# Patient Record
Sex: Female | Born: 1990 | Race: White | Hispanic: No | Marital: Married | Smoking: Never smoker
Health system: Southern US, Community
[De-identification: ages and names within clinical notes are randomized; demographics above are authoritative.]

## PROBLEM LIST (undated history)

## (undated) DIAGNOSIS — Z9889 Other specified postprocedural states: Secondary | ICD-10-CM

## (undated) DIAGNOSIS — R112 Nausea with vomiting, unspecified: Secondary | ICD-10-CM

## (undated) DIAGNOSIS — C439 Malignant melanoma of skin, unspecified: Secondary | ICD-10-CM

## (undated) DIAGNOSIS — K219 Gastro-esophageal reflux disease without esophagitis: Secondary | ICD-10-CM

## (undated) DIAGNOSIS — G971 Other reaction to spinal and lumbar puncture: Secondary | ICD-10-CM

## (undated) DIAGNOSIS — K649 Unspecified hemorrhoids: Secondary | ICD-10-CM

## (undated) DIAGNOSIS — O285 Abnormal chromosomal and genetic finding on antenatal screening of mother: Secondary | ICD-10-CM

## (undated) DIAGNOSIS — I8390 Asymptomatic varicose veins of unspecified lower extremity: Secondary | ICD-10-CM

## (undated) HISTORY — PX: OTHER SURGICAL HISTORY: SHX169

## (undated) HISTORY — DX: Other reaction to spinal and lumbar puncture: G97.1

## (undated) HISTORY — DX: Other specified postprocedural states: Z98.890

## (undated) HISTORY — DX: Abnormal chromosomal and genetic finding on antenatal screening of mother: O28.5

## (undated) HISTORY — DX: Nausea with vomiting, unspecified: R11.2

## (undated) HISTORY — DX: Malignant melanoma of skin, unspecified: C43.9

## (undated) HISTORY — DX: Unspecified hemorrhoids: K64.9

---
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
## 2011-02-14 ENCOUNTER — Ambulatory Visit (INDEPENDENT_AMBULATORY_CARE_PROVIDER_SITE_OTHER): Payer: 59 | Admitting: Family Medicine

## 2011-02-14 ENCOUNTER — Encounter: Payer: Self-pay | Admitting: Family Medicine

## 2011-02-14 VITALS — BP 110/70 | HR 76 | Temp 98.3°F | Wt 123.4 lb

## 2011-02-14 DIAGNOSIS — L210 Seborrhea capitis: Secondary | ICD-10-CM | POA: Insufficient documentation

## 2011-02-14 MED ORDER — FLUOCINONIDE 0.05 % EX CREA: TOPICAL_CREAM | CUTANEOUS | Status: DC

## 2011-02-14 MED ORDER — ACYCLOVIR 400 MG PO TABS: 400.0000 mg | ORAL_TABLET | Freq: Every day | ORAL | Status: AC

## 2011-02-14 NOTE — Progress Notes (Signed)
  Subjective:    Patient ID: Hannah Walsh, female    DOB: July 21, 1990, 20 y.o.   MRN: [id number]  HPI  Rash Patient presents for evaluation of a rash involving the chest, shoulder and trunk. Rash started 3 weeks ago. Lesions are pink, and raised in texture. Rash has not changed over time. Rash is pruritic. Associated symptoms: none. Patient denies: abdominal pain, arthralgia, fever, headache, irritability and myalgia. Patient has not had contacts with similar rash. Patient has not had new exposures (soaps, lotions, laundry detergents, foods, medications, plants, insects or animals).  Was told by derm that it is pityriasis but did not give her any medication to take. Benadryl not helping with itching.   Patient Active Problem List  Diagnoses  . Routine general medical examination at a health care facility   No past medical history on file. No past surgical history on file. History  Substance Use Topics  . Smoking status: Never Smoker   . Smokeless tobacco: Not on file  . Alcohol Use: Not on file   Family History  Problem Relation Age of Onset  . Heart disease Father 49    MI  . Heart disease Maternal Grandfather 40    died of MI   No Known Allergies Current Outpatient Prescriptions on File Prior to Visit  Medication Sig Dispense Refill  . norethindrone-ethinyl estradiol (JUNEL FE 1/20) 1-20 MG-MCG per tablet Take 1 tablet by mouth daily.         The PMH, PSH, Social History, Family History, Medications, and allergies have been reviewed in [facility], and have been updated if relevant.   Review of Systems    See HPI Objective:   Physical Exam Gen:  Alert, NAD Skin:  Multiple raised, erythematous lesions on neck, trunk, back Psych:  Pleasant, NAD       Assessment & Plan:   1. Pityriasis    New.  Discussed self limiting nature. Will try acyclovir, antiviral and add lidex cream.

## 2011-07-22 ENCOUNTER — Ambulatory Visit (INDEPENDENT_AMBULATORY_CARE_PROVIDER_SITE_OTHER): Payer: 59 | Admitting: Family Medicine

## 2011-07-22 ENCOUNTER — Encounter: Payer: Self-pay | Admitting: Family Medicine

## 2011-07-22 VITALS — BP 102/80 | HR 87 | Temp 98.3°F | Wt 126.5 lb

## 2011-07-22 DIAGNOSIS — J029 Acute pharyngitis, unspecified: Secondary | ICD-10-CM

## 2011-07-22 MED ORDER — AMOXICILLIN 400 MG/5ML PO SUSR: 400.0000 mg | Freq: Three times a day (TID) | ORAL | Status: DC

## 2011-07-22 MED ORDER — AMOXICILLIN 400 MG/5ML PO SUSR: 400.0000 mg | Freq: Three times a day (TID) | ORAL | Status: AC

## 2011-07-22 NOTE — Progress Notes (Signed)
SUBJECTIVE: 21 y.o. female with sore throat, myalgias, swollen glands, headache and fever for 3 days. No history of rheumatic fever. Other symptoms: left ear pain.  Patient Active Problem List  Diagnoses  . Routine general medical examination at a health care facility  . Pityriasis   No past medical history on file. No past surgical history on file. History  Substance Use Topics  . Smoking status: Never Smoker   . Smokeless tobacco: Not on file  . Alcohol Use: Not on file   Family History  Problem Relation Age of Onset  . Heart disease Father 89    MI  . Heart disease Maternal Grandfather 40    died of MI   No Known Allergies Current Outpatient Prescriptions on File Prior to Visit  Medication Sig Dispense Refill  . norethindrone-ethinyl estradiol (JUNEL FE 1/20) 1-20 MG-MCG per tablet Take 1 tablet by mouth daily.         The PMH, PSH, Social History, Family History, Medications, and allergies have been reviewed in [facility], and have been updated if relevant.  OBJECTIVE:  BP 102/80  Pulse 87  Temp(Src) 98.3 F (36.8 C) (Oral)  Wt 126 lb 8 oz (57.38 kg)  Appears alert, well appearing, and in no distress. Ears: right ear normal, left TM red, dull, bulging Oropharynx: tonsils hypertrophied with exudate Neck: supple, no significant adenopathy Lungs: clear to auscultation, no wheezes, rales or rhonchi, symmetric air entry Rapid Strep test is negative  ASSESSMENT: Streptococcal pharyngitis- rapid strep negative to tonsils quite hypertrophied.   PLAN: Per orders. Gargle, use acetaminophen or other OTC analgesic, and take Rx fully as prescribed- amoxicillin. Call if other family members develop similar symptoms. See prn.

## 2011-08-15 ENCOUNTER — Ambulatory Visit: Payer: 59 | Admitting: Family Medicine

## 2011-08-15 ENCOUNTER — Ambulatory Visit (INDEPENDENT_AMBULATORY_CARE_PROVIDER_SITE_OTHER): Payer: 59 | Admitting: Family Medicine

## 2011-08-15 ENCOUNTER — Encounter: Payer: Self-pay | Admitting: Family Medicine

## 2011-08-15 VITALS — BP 112/80 | HR 80 | Temp 98.2°F | Wt 129.0 lb

## 2011-08-15 DIAGNOSIS — J019 Acute sinusitis, unspecified: Secondary | ICD-10-CM | POA: Insufficient documentation

## 2011-08-15 MED ORDER — AMOXICILLIN-POT CLAVULANATE 875-125 MG PO TABS: 1.0000 | ORAL_TABLET | Freq: Two times a day (BID) | ORAL | Status: DC

## 2011-08-15 NOTE — Progress Notes (Signed)
  Subjective:    Patient ID: Hannah Walsh, female    DOB: Oct 09, 1990, 21 y.o.   MRN: [id number]  HPI CC: Sinus infx?  Seen here 07/22/2011 and treated for strep pharyngitis with course of amoxicillin 400mg  tid x 10 days.  After full course of abx, felt well for 3 days then sxs started returning.  Now for last week having significant head congestion throughout the whole day as well as headaches described as sinus pressure, green mucous.  Seems to be clearing up.  Still some blood.  ST intermittently.  Mild cough.  + PNdrainage.  Tried advil cold and sinus, benadryl, OTC mucinex.  No fevers/chills, abd pain, v, ear pain, tooth pain.  No new rashes.  Dad smokes at home. + boyfriend, dad and sister sick.  No h/o asthma.  Review of Systems Per HPI    Objective:   Physical Exam  Nursing note and vitals reviewed. Constitutional: She appears well-developed and well-nourished. No distress.  HENT:  Head: Normocephalic and atraumatic.  Right Ear: Hearing, tympanic membrane, external ear and ear canal normal.  Left Ear: Hearing, tympanic membrane, external ear and ear canal normal.  Nose: No mucosal edema or rhinorrhea. Right sinus exhibits maxillary sinus tenderness. Right sinus exhibits no frontal sinus tenderness. Left sinus exhibits maxillary sinus tenderness. Left sinus exhibits no frontal sinus tenderness.  Mouth/Throat: Uvula is midline and mucous membranes are normal. Posterior oropharyngeal erythema present. No oropharyngeal exudate, posterior oropharyngeal edema or tonsillar abscesses.       Evidently congested  Eyes: Conjunctivae and EOM are normal. Pupils are equal, round, and reactive to light. No scleral icterus.  Neck: Normal range of motion. Neck supple.  Cardiovascular: Normal rate, regular rhythm, normal heart sounds and intact distal pulses.   No murmur heard. Pulmonary/Chest: Effort normal and breath sounds normal. No respiratory distress. She has no wheezes. She has no rales.    Musculoskeletal: She exhibits no edema.  Lymphadenopathy:    She has no cervical adenopathy.  Skin: Skin is warm and dry. No rash noted.       Assessment & Plan:

## 2011-08-15 NOTE — Patient Instructions (Signed)
You have a sinus infection. Take medicine as prescribed: augmentin twice daily for 10 days Push fluids and plenty of rest. Nasal saline irrigation or neti pot to help drain sinuses. May use simple mucinex with plenty of fluid to help mobilize mucous. Let us know if fever >101.5, trouble opening/closing mouth, difficulty swallowing, or worsening - you may need to be seen again. Let us know if problems with antibiotic.

## 2011-08-15 NOTE — Assessment & Plan Note (Signed)
Given progression of sxs and recent abx use will treat as bacterial sinusitis with 10d course augmentin. Supportive care as per instructions. Discussed 2nd form birth control while on abx.

## 2011-09-23 ENCOUNTER — Encounter (HOSPITAL_COMMUNITY): Payer: Self-pay | Admitting: Emergency Medicine

## 2011-09-23 ENCOUNTER — Emergency Department (HOSPITAL_COMMUNITY)
Admission: EM | Admit: 2011-09-23 | Discharge: 2011-09-23 | Disposition: A | Discharge status: Home Health | Payer: 59 | Attending: Emergency Medicine | Admitting: Emergency Medicine

## 2011-09-23 ENCOUNTER — Telehealth: Payer: Self-pay | Admitting: Family Medicine

## 2011-09-23 ENCOUNTER — Emergency Department (HOSPITAL_COMMUNITY): Payer: 59

## 2011-09-23 DIAGNOSIS — M533 Sacrococcygeal disorders, not elsewhere classified: Secondary | ICD-10-CM | POA: Insufficient documentation

## 2011-09-23 DIAGNOSIS — T148XXA Other injury of unspecified body region, initial encounter: Secondary | ICD-10-CM

## 2011-09-23 MED ORDER — OXYCODONE-ACETAMINOPHEN 5-325 MG PO TABS: 2.0000 | ORAL_TABLET | ORAL | Status: AC | PRN

## 2011-09-23 MED ORDER — IBUPROFEN 800 MG PO TABS
800.0000 mg | ORAL_TABLET | Freq: Once | ORAL | Status: AC
  Administered 2011-09-23: 800 mg via ORAL
  Filled 2011-09-23: qty 1

## 2011-09-23 MED ORDER — IBUPROFEN 800 MG PO TABS: 800.0000 mg | ORAL_TABLET | Freq: Three times a day (TID) | ORAL | Status: AC

## 2011-09-23 NOTE — Discharge Instructions (Signed)
Hannah Walsh take ibuprofen 800 every 6-8 hours with food for your pain. For severe pain he can take the Percocet do not drive with this medication. The x-rays today do not show her fracture to be her coccyx bone. Followup with your primary care physician this week. Return to the ER for severe pain or any other concerns.  Contusion A contusion is a deep bruise. Contusions happen when an injury causes bleeding under the skin. Signs of bruising include pain, puffiness (swelling), and discolored skin. The contusion may turn blue, purple, or yellow. HOME CARE   Put ice on the injured area.   Put ice in a plastic bag.   Place a towel between your skin and the bag.   Leave the ice on for 15 to 20 minutes, 3 to 4 times a day.   Only take medicine as told by your doctor.   Rest the injured area.   If possible, raise (elevate) the injured area to lessen puffiness.  GET HELP RIGHT AWAY IF:   You have more bruising or puffiness.   You have pain that is getting worse.   Your puffiness or pain is not helped by medicine.  MAKE SURE YOU:   Understand these instructions.   Will watch your condition.   Will get help right away if you are not doing well or get worse.  Document Released: 12/04/2007 Document Revised: 06/06/2011 Document Reviewed: 04/22/2011 [facility] Patient Information 2012 [place], [place].Contusion A contusion is a deep bruise. Contusions happen when an injury causes bleeding under the skin. Signs of bruising include pain, puffiness (swelling), and discolored skin. The contusion may turn blue, purple, or yellow. HOME CARE   Put ice on the injured area.   Put ice in a plastic bag.   Place a towel between your skin and the bag.   Leave the ice on for 15 to 20 minutes, 3 to 4 times a day.   Only take medicine as told by your doctor.   Rest the injured area.   If possible, raise (elevate) the injured area to lessen puffiness.  GET HELP RIGHT AWAY IF:   You have more  bruising or puffiness.   You have pain that is getting worse.   Your puffiness or pain is not helped by medicine.  MAKE SURE YOU:   Understand these instructions.   Will watch your condition.   Will get help right away if you are not doing well or get worse.  Document Released: 12/04/2007 Document Revised: 06/06/2011 Document Reviewed: 04/22/2011 [facility] Patient Information 2012 [place], [place].Contusion A contusion is a deep bruise. Contusions happen when an injury causes bleeding under the skin. Signs of bruising include pain, puffiness (swelling), and discolored skin. The contusion may turn blue, purple, or yellow. HOME CARE   Put ice on the injured area.   Put ice in a plastic bag.   Place a towel between your skin and the bag.   Leave the ice on for 15 to 20 minutes, 3 to 4 times a day.   Only take medicine as told by your doctor.   Rest the injured area.   If possible, raise (elevate) the injured area to lessen puffiness.  GET HELP RIGHT AWAY IF:   You have more bruising or puffiness.   You have pain that is getting worse.   Your puffiness or pain is not helped by medicine.  MAKE SURE YOU:   Understand these instructions.   Will watch your condition.   Will  get help right away if you are not doing well or get worse.  Document Released: 12/04/2007 Document Revised: 06/06/2011 Document Reviewed: 04/22/2011 [facility] Patient Information 2012 [place], [place].Cryotherapy Cryotherapy means treatment with cold. Ice or gel packs can be used to reduce both pain and swelling. Ice is the most helpful within the first 24 to 48 hours after an injury or flareup from overusing a muscle or joint. Sprains, strains, spasms, burning pain, shooting pain, and aches can all be eased with ice. Ice can also be used when recovering from surgery. Ice is effective, has very few side effects, and is safe for most people to use. PRECAUTIONS  Ice is not a safe treatment option for people  with:  Raynaud's phenomenon. This is a condition affecting small blood vessels in the extremities. Exposure to cold may cause your problems to return.   Cold hypersensitivity. There are many forms of cold hypersensitivity, including:   Cold urticaria. Red, itchy hives appear on the skin when the tissues begin to warm after being iced.   Cold erythema. This is a red, itchy rash caused by exposure to cold.   Cold hemoglobinuria. Red blood cells break down when the tissues begin to warm after being iced. The hemoglobin that carry oxygen are passed into the urine because they cannot combine with blood proteins fast enough.   Numbness or altered sensitivity in the area being iced.  If you have any of the following conditions, do not use ice until you have discussed cryotherapy with your caregiver:  Heart conditions, such as arrhythmia, angina, or chronic heart disease.   High blood pressure.   Healing wounds or open skin in the area being iced.   Current infections.   Rheumatoid arthritis.   Poor circulation.   Diabetes.  Ice slows the blood flow in the region it is applied. This is beneficial when trying to stop inflamed tissues from spreading irritating chemicals to surrounding tissues. However, if you expose your skin to cold temperatures for too long or without the proper protection, you can damage your skin or nerves. Watch for signs of skin damage due to cold. HOME CARE INSTRUCTIONS Follow these tips to use ice and cold packs safely.  Place a dry or damp towel between the ice and skin. A damp towel will cool the skin more quickly, so you may need to shorten the time that the ice is used.   For a more rapid response, add gentle compression to the ice.   Ice for no more than 10 to 20 minutes at a time. The bonier the area you are icing, the less time it will take to get the benefits of ice.   Check your skin after 5 minutes to make sure there are no signs of a poor response to  cold or skin damage.   Rest 20 minutes or more in between uses.   Once your skin is numb, you can end your treatment. You can test numbness by very lightly touching your skin. The touch should be so light that you do not see the skin dimple from the pressure of your fingertip. When using ice, most people will feel these normal sensations in this order: cold, burning, aching, and numbness.   Do not use ice on someone who cannot communicate their responses to pain, such as small children or people with dementia.  HOW TO MAKE AN ICE PACK Ice packs are the most common way to use ice therapy. Other methods include ice massage,  ice baths, and cryo-sprays. Muscle creams that cause a cold, tingly feeling do not offer the same benefits that ice offers and should not be used as a substitute unless recommended by your caregiver. To make an ice pack, do one of the following:  Place crushed ice or a bag of frozen vegetables in a sealable plastic bag. Squeeze out the excess air. Place this bag inside another plastic bag. Slide the bag into a pillowcase or place a damp towel between your skin and the bag.   Mix 3 parts water with 1 part rubbing alcohol. Freeze the mixture in a sealable plastic bag. When you remove the mixture from the freezer, it will be slushy. Squeeze out the excess air. Place this bag inside another plastic bag. Slide the bag into a pillowcase or place a damp towel between your skin and the bag.  SEEK MEDICAL CARE IF:  You develop white spots on your skin. This may give the skin a blotchy (mottled) appearance.   Your skin turns blue or pale.   Your skin becomes waxy or hard.   Your swelling gets worse.  MAKE SURE YOU:   Understand these instructions.   Will watch your condition.   Will get help right away if you are not doing well or get worse.  Document Released: 02/11/2011 Document Revised: 06/06/2011 Document Reviewed: 02/11/2011 [facility] Patient Information 2012 [place],  [place].

## 2011-09-23 NOTE — ED Notes (Signed)
Pt sts fell down stairs today c/o pain in tailbone; pt denies other injury

## 2011-09-23 NOTE — Telephone Encounter (Signed)
Caller: Samantha/Mother; PCP: Ruthe Mannan (Nestor Ramp); CB#: [phone]; LMP not sure.  Pt says no possiblity of pregnancy.  Mom calling today 09/23/11 she was walking at back door and missed step and fell and landed on her bottom.  Having throbbing pain in tail bone area.  Mom wanted to know if she should be taken in for x-ray.  No broken skin or bruising.  Child is able to walk but very painful.  Emergent symptoms r/o by Trauma Tailbone pediatric guidelines with exception of can't walk or difficult to walk.  Contacted office and spoke with Aram Beecham who spoke with Dr. Dayton Martes and MD advised to send pt on to ED.  Care advice given.

## 2011-09-23 NOTE — ED Provider Notes (Signed)
History     CSN: [id number]  Arrival date & time 09/23/11  1558   First MD Initiated Contact with Patient 09/23/11 2028      Chief Complaint  Patient presents with  . Fall  . Tailbone Pain    (Consider location/radiation/quality/duration/timing/severity/associated sxs/prior treatment) Patient is a 21 y.o. female presenting with fall. The history is provided by the patient. No language interpreter was used.  Fall The accident occurred 6 to 12 hours ago. The fall occurred while standing. Distance fallen: from standing position. She landed on a hard floor. There was no blood loss. Point of impact: buttocks. Pain location: coccyx. The pain is at a severity of 8/10. The pain is moderate. She was ambulatory at the scene. There was no entrapment after the fall. There was no drug use involved in the accident. There was no alcohol use involved in the accident. Pertinent negatives include no fever, no numbness, no nausea, no vomiting and no loss of consciousness. The symptoms are aggravated by activity and use of the injured limb. She has tried NSAIDs for the symptoms. The treatment provided mild relief.   This reports falling down her stairs today and landing straight on her bottom. Complaining of coccyx pain. Has taken ibuprofen with some relief. History reviewed. No pertinent past medical history.  History reviewed. No pertinent past surgical history.  Family History  Problem Relation Age of Onset  . Heart disease Father 15    MI  . Heart disease Maternal Grandfather 40    died of MI    History  Substance Use Topics  . Smoking status: Never Smoker   . Smokeless tobacco: Not on file  . Alcohol Use: Not on file    OB History    Grav Para Term Preterm Abortions TAB SAB Ect Mult Living                  Review of Systems  Constitutional: Negative for fever.  Gastrointestinal: Negative for nausea and vomiting.  Genitourinary: Negative for pelvic pain.  Musculoskeletal: Negative  for back pain and gait problem.       Coccyx bone pain  Neurological: Negative for loss of consciousness and numbness.    Allergies  Review of patient's allergies indicates no known allergies.  Home Medications   Current Outpatient Rx  Name Route Sig Dispense Refill  . IBUPROFEN 200 MG PO TABS Oral Take 600 mg by mouth every 6 (six) hours as needed. For pain    . NORETHIN ACE-ETH ESTRAD-FE 1-20 MG-MCG PO TABS Oral Take 1 tablet by mouth daily.        BP 103/71  Pulse 80  Temp(Src) 97.8 F (36.6 C) (Oral)  Resp 18  SpO2 100%  Physical Exam  Nursing note and vitals reviewed. Constitutional: She is oriented to person, place, and time. She appears well-developed and well-nourished.  HENT:  Head: Normocephalic and atraumatic.  Eyes: Conjunctivae and EOM are normal. Pupils are equal, round, and reactive to light.  Neck: Normal range of motion. Neck supple.  Cardiovascular: Normal rate.   Pulmonary/Chest: Effort normal.  Abdominal: Soft.  Musculoskeletal: Normal range of motion. She exhibits tenderness. She exhibits no edema.       Tenderness to coccyx area  Neurological: She is alert and oriented to person, place, and time. She has normal reflexes.  Skin: Skin is warm and dry.  Psychiatric: She has a normal mood and affect.    ED Course  Procedures (including critical care time)  Labs Reviewed - No data to display Dg Sacrum/coccyx  09/23/2011  *RADIOLOGY REPORT*  Clinical Data: , fall.  Pain in tail bone region  SACRUM AND COCCYX - 2+ VIEW  Comparison: None.  Findings: There is no evidence of fracture or dislocation.  There is no evidence of arthropathy or other focal bone abnormality. Soft tissues are unremarkable.  IMPRESSION: Negative exam.  Original Report Authenticated By: Rosealee Albee, M.D.     No diagnosis found.    MDM  Coccyx pain after fall today.  X-ray negative for fracture.  Ibuprofen and percocet /ice for pain.  Follow up with pcp or return if  worse.       Jethro Bastos, NP 09/24/11 1108

## 2011-09-23 NOTE — Telephone Encounter (Signed)
[facility] Triage Call Report Triage Record Num: [id number] Operator: Thayer Headings Patient Name: Hannah Walsh Call Date & Time: 09/23/2011 3:01:01PM Patient Phone: [phone] PCP: Ruthe Mannan Patient Gender: Female PCP Fax : [phone] Patient DOB: [birth year] Practice Name: [facility] Reason for Call: Caller: Samantha/Mother; PCP: Ruthe Mannan Nestor Ramp); CB#: [phone]; LMP not sure. Pt says no possiblity of pregnancy. Mom calling today 09/23/11 she was walking at back door and missed step and fell and landed on her bottom. Having throbbing pain in tail bone area. Mom wanted to know if she should be taken in for x-ray. No broken skin or bruising. Child is able to walk but very painful. Emergent symptoms r/o by Trauma Tailbone pediatric guidelines with exception of can't walk or difficult to walk. Contacted office and spoke with Aram Beecham who spoke with Dr. Dayton Martes and MD advised to send pt on to ED. Care advice given. Protocol(s) Used: Trauma - Tailbone (Pediatric) Recommended Outcome per Protocol: See ED Immediately Override Outcome if Used in Protocol: Call Provider Immediately RN Reason for Override Outcome: Office Is Open. Reason for Outcome: Can't walk or difficult to walk Care Advice: ~ CARE ADVICE given per Trauma - Tailbone (Pediatric) guideline. GO TO ED NOW: Your child needs to be seen in the Emergency Department immediately. Go to the ER at ___________ Hospital. Leave now. Drive carefully. ~ ~ PAIN: For pain relief, give acetaminophen every 4 hours OR ibuprofen every 6 hours as needed. (See Dosage table) 03

## 2011-09-23 NOTE — ED Notes (Signed)
Discharge instructions reviewed with pt; verbalizes understanding.  No questions asked; no further c/o's voiced.  Pt ambulatory to lobby.  NAD noted. 

## 2011-09-25 NOTE — ED Provider Notes (Signed)
Medical screening examination/treatment/procedure(s) were performed by non-physician practitioner and as supervising physician I was immediately available for consultation/collaboration.   Carleene Cooper III, MD 09/25/11 1031

## 2011-11-27 ENCOUNTER — Telehealth: Payer: Self-pay

## 2011-11-27 NOTE — Telephone Encounter (Signed)
Pt starting dental hygiene school;inquiring about immunizations. No immunizations available in epic,centricity or paper chart.Pt will ck with pediatrician.

## 2011-12-12 ENCOUNTER — Encounter: Payer: Self-pay | Admitting: Family Medicine

## 2011-12-12 ENCOUNTER — Ambulatory Visit (INDEPENDENT_AMBULATORY_CARE_PROVIDER_SITE_OTHER): Payer: 59 | Admitting: Family Medicine

## 2011-12-12 VITALS — BP 100/80 | HR 84 | Temp 98.9°F | Ht 62.0 in | Wt 126.0 lb

## 2011-12-12 DIAGNOSIS — Z0000 Encounter for general adult medical examination without abnormal findings: Secondary | ICD-10-CM

## 2011-12-12 DIAGNOSIS — Z23 Encounter for immunization: Secondary | ICD-10-CM

## 2011-12-12 DIAGNOSIS — Z136 Encounter for screening for cardiovascular disorders: Secondary | ICD-10-CM

## 2011-12-12 LAB — COMPREHENSIVE METABOLIC PANEL
ALT: 13 U/L (ref 0–35)
AST: 19 U/L (ref 0–37)
Alkaline Phosphatase: 38 U/L — ABNORMAL LOW (ref 39–117)
BUN: 8 mg/dL (ref 6–23)
Calcium: 9.8 mg/dL (ref 8.4–10.5)
Chloride: 105 mEq/L (ref 96–112)
Creatinine, Ser: 0.7 mg/dL (ref 0.4–1.2)
Total Bilirubin: 0.5 mg/dL (ref 0.3–1.2)

## 2011-12-12 LAB — LIPID PANEL
HDL: 53.7 mg/dL (ref >39.00)
LDL Cholesterol: 97 mg/dL (ref 0–99)
Total CHOL/HDL Ratio: 3
Triglycerides: 135 mg/dL (ref 0.0–149.0)
VLDL: 27 mg/dL (ref 0.0–40.0)

## 2011-12-12 NOTE — Addendum Note (Signed)
Addended by: Eliezer Bottom on: 12/12/2011 12:06 PM   Modules accepted: Orders

## 2011-12-12 NOTE — Patient Instructions (Addendum)
Good to see you. Have a great summer! Hannah Walsh will make an appointment for you to have your TB skin test next week. We will call you with your lab results.

## 2011-12-12 NOTE — Progress Notes (Signed)
21 yo healthy female here for CPX.  Sees GYN, Dr. Dareen Piano. On Junel to regulate her periods. Denies any dysuria, vaginal discharge or other complaints. Has never had lipids checked.  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  Patient Active Problem List  Diagnosis  . Routine general medical examination at a health care facility  . Pityriasis  . Sinusitis acute   No past medical history on file. No past surgical history on file. History  Substance Use Topics  . Smoking status: Never Smoker   . Smokeless tobacco: Not on file  . Alcohol Use: Not on file   Family History  Problem Relation Age of Onset  . Heart disease Father 32    MI  . Heart disease Maternal Grandfather 40    died of MI   No Known Allergies Current Outpatient Prescriptions on File Prior to Visit  Medication Sig Dispense Refill  . ibuprofen (ADVIL,MOTRIN) 200 MG tablet Take 600 mg by mouth every 6 (six) hours as needed. For pain      . norethindrone-ethinyl estradiol (JUNEL FE 1/20) 1-20 MG-MCG per tablet Take 1 tablet by mouth daily.         The PMH, PSH, Social History, Family History, Medications, and allergies have been reviewed in [facility], and have been updated if relevant.  ROS: Patient reports no  vision/ hearing changes,anorexia, weight change, fever ,adenopathy, persistant / recurrent hoarseness, swallowing issues, chest pain, edema,persistant / recurrent cough, hemoptysis, dyspnea(rest, exertional, paroxysmal nocturnal), gastrointestinal  bleeding (melena, rectal bleeding), abdominal pain, excessive heart burn, GU symptoms(dysuria, hematuria, pyuria, voiding/incontinence  Issues) syncope, focal weakness, severe memory loss, concerning skin lesions, depression, anxiety, abnormal bruising/bleeding, major joint swelling, breast masses or abnormal vaginal bleeding.      Family History  Problem Relation Age  of Onset  . Heart disease Father 43    MI  . Heart disease Maternal Grandfather 40    died of MI    No Known Allergies  The PMH, PSH, Social History, Family History, Medications, and allergies have been reviewed in [facility], and have been updated if relevant.  Physical exam: BP 100/80  Pulse 84  Temp 98.9 F (37.2 C)  Ht 5\' 2"  (1.575 m)  Wt 126 lb (57.153 kg)  BMI 23.05 kg/m2  General:  Well-developed,well-nourished,in no acute distress; alert,appropriate and cooperative throughout examination Head:  normocephalic and atraumatic.   Eyes:  vision grossly intact, pupils equal, pupils round, and pupils reactive to light.   Ears:  R ear normal and L ear normal.   Nose:  no external deformity.   Mouth:  good dentition.   Neck:  No deformities, masses, or tenderness noted. Lungs:  Normal respiratory effort, chest expands symmetrically. Lungs are clear to auscultation, no crackles or wheezes. Heart:  Normal rate and regular rhythm. S1 and S2 normal without gallop, murmur, click, rub or other extra sounds. Abdomen:  Bowel sounds positive,abdomen soft and non-tender without masses, organomegaly or hernias noted. Msk:  No deformity or scoliosis noted of thoracic or lumbar spine.   Extremities:  No clubbing, cyanosis, edema, or deformity noted with normal full range of motion of all joints.   Neurologic:  alert & oriented X3 and gait normal.   Skin:  Intact without suspicious lesions or rashes Cervical Nodes:  No lymphadenopathy noted Psych:  Cognition and judgment appear intact. Alert and cooperative with normal attention span and  concentration. No apparent delusions, illusions, hallucinations   Assessment and Plan: 1. Routine general medical examination at a health care facility  Reviewed preventive care protocols, scheduled due services, and updated immunizations Discussed nutrition, exercise, diet, and healthy lifestyle.  Comprehensive metabolic panel Tdap today TB skin test  2.  Screening for ischemic heart disease  Lipid Panel

## 2011-12-17 ENCOUNTER — Ambulatory Visit (INDEPENDENT_AMBULATORY_CARE_PROVIDER_SITE_OTHER): Payer: 59 | Admitting: *Deleted

## 2011-12-17 DIAGNOSIS — Z111 Encounter for screening for respiratory tuberculosis: Secondary | ICD-10-CM

## 2011-12-19 LAB — TB SKIN TEST: TB Skin Test: NEGATIVE

## 2012-03-12 ENCOUNTER — Encounter: Payer: Self-pay | Admitting: Family Medicine

## 2012-03-12 ENCOUNTER — Ambulatory Visit (INDEPENDENT_AMBULATORY_CARE_PROVIDER_SITE_OTHER): Payer: 59 | Admitting: Family Medicine

## 2012-03-12 VITALS — BP 120/80 | HR 92 | Temp 97.4°F | Wt 130.0 lb

## 2012-03-12 DIAGNOSIS — J029 Acute pharyngitis, unspecified: Secondary | ICD-10-CM

## 2012-03-12 DIAGNOSIS — J069 Acute upper respiratory infection, unspecified: Secondary | ICD-10-CM

## 2012-03-12 MED ORDER — AMOXICILLIN-POT CLAVULANATE 875-125 MG PO TABS: 1.0000 | ORAL_TABLET | Freq: Two times a day (BID) | ORAL | Status: AC

## 2012-03-12 NOTE — Progress Notes (Signed)
SUBJECTIVE:  Hannah Walsh is a 21 y.o. female who complains of coryza, congestion, sore throat and myalgias for 2 days. She denies a history of anorexia, chest pain, chills, dizziness, fatigue and fevers and denies a history of asthma. Patient denies smoke cigarettes.   Patient Active Problem List  Diagnosis  . Routine general medical examination at a health care facility  . Pityriasis  . Sinusitis acute   No past medical history on file. No past surgical history on file. History  Substance Use Topics  . Smoking status: Never Smoker   . Smokeless tobacco: Not on file  . Alcohol Use: Not on file   Family History  Problem Relation Age of Onset  . Heart disease Father 82    MI  . Heart disease Maternal Grandfather 40    died of MI   No Known Allergies Current Outpatient Prescriptions on File Prior to Visit  Medication Sig Dispense Refill  . ibuprofen (ADVIL,MOTRIN) 200 MG tablet Take 600 mg by mouth every 6 (six) hours as needed. For pain      . norethindrone-ethinyl estradiol (JUNEL FE 1/20) 1-20 MG-MCG per tablet Take 1 tablet by mouth daily.         The PMH, PSH, Social History, Family History, Medications, and allergies have been reviewed in [facility], and have been updated if relevant.  OBJECTIVE: BP 120/80  Pulse 92  Temp 97.4 F (36.3 C)  Wt 130 lb (58.968 kg)  She appears well, vital signs are as noted. Ears normal.  Throat and pharynx normal.  Neck supple. No adenopathy in the neck. Nose is congested. Sinuses non tender. The chest is clear, without wheezes or rales.  ASSESSMENT:  viral upper respiratory illness  PLAN: Symptomatic therapy suggested: push fluids, rest and return office visit prn if symptoms persist or worsen. Lack of antibiotic effectiveness discussed with her. Call or return to clinic prn if these symptoms worsen or fail to improve as anticipated.

## 2012-03-12 NOTE — Patient Instructions (Addendum)
Please take Ibuprofen 800 mg three times daily for next few days (with food). Drink plenty of fluids. Continue taking your allergy medicine. You can take your Augmentin if symptoms worsen or don't improve over next 5-7 days.

## 2012-03-12 NOTE — Addendum Note (Signed)
Addended by: Eliezer Bottom on: 03/12/2012 08:58 AM   Modules accepted: Orders

## 2012-07-24 ENCOUNTER — Telehealth: Payer: Self-pay | Admitting: *Deleted

## 2012-07-24 NOTE — Telephone Encounter (Signed)
Form is on your desk, just needs your review and signature.

## 2012-07-24 NOTE — Telephone Encounter (Signed)
PT dropped off forms for [facility] to be Signed by Dr. Dayton Martes.  Please call Jason when ready to pick up at [phone] or [phone].

## 2012-07-27 NOTE — Telephone Encounter (Signed)
Form placed up front for pick up, advised patient.

## 2012-07-27 NOTE — Telephone Encounter (Signed)
Form signed and on my desk. 

## 2012-10-19 ENCOUNTER — Emergency Department (INDEPENDENT_AMBULATORY_CARE_PROVIDER_SITE_OTHER)
Admission: EM | Admit: 2012-10-19 | Discharge: 2012-10-19 | Disposition: A | Discharge status: Home / Self Care | Payer: 59 | Source: Home / Self Care | Attending: Emergency Medicine | Admitting: Emergency Medicine

## 2012-10-19 ENCOUNTER — Encounter (HOSPITAL_COMMUNITY): Payer: Self-pay

## 2012-10-19 DIAGNOSIS — J029 Acute pharyngitis, unspecified: Secondary | ICD-10-CM

## 2012-10-19 DIAGNOSIS — I889 Nonspecific lymphadenitis, unspecified: Secondary | ICD-10-CM

## 2012-10-19 MED ORDER — ACETAMINOPHEN-CODEINE #3 300-30 MG PO TABS: 1.0000 | ORAL_TABLET | Freq: Four times a day (QID) | ORAL | Status: DC | PRN

## 2012-10-19 MED ORDER — AZITHROMYCIN 250 MG PO TABS: 250.0000 mg | ORAL_TABLET | Freq: Every day | ORAL | Status: AC

## 2012-10-19 NOTE — ED Provider Notes (Signed)
History     CSN: [id number]  Arrival date & time 10/19/12  1516   First MD Initiated Contact with Patient 10/19/12 1532      Chief Complaint  Patient presents with  . Sore Throat    (Consider location/radiation/quality/duration/timing/severity/associated sxs/prior treatment) HPI Comments: Patient presents to urgent care describing for 2 days she has had a sore throat and tactile fevers at home not feeling her usual self poor energy and fatigue. She participated in a mud-marathon this weekend. She has some mild nasal congestion. No abdominal pain, nausea vomiting or diarrhea as. Just taken some Advil for fevers at home.  Patient is a 22 y.o. female presenting with pharyngitis.  Sore Throat This is a new problem. The current episode started 2 days ago. The problem occurs constantly. The problem has been gradually worsening. Pertinent negatives include no abdominal pain, no headaches and no shortness of breath. The symptoms are aggravated by swallowing. Nothing relieves the symptoms. She has tried nothing for the symptoms. The treatment provided no relief.    History reviewed. No pertinent past medical history.  History reviewed. No pertinent past surgical history.  Family History  Problem Relation Age of Onset  . Heart disease Father 55    MI  . Heart disease Maternal Grandfather 40    died of MI    History  Substance Use Topics  . Smoking status: Never Smoker   . Smokeless tobacco: Not on file  . Alcohol Use: Not on file    OB History   Grav Para Term Preterm Abortions TAB SAB Ect Mult Living                  Review of Systems  Constitutional: Positive for fever, activity change and appetite change. Negative for chills and unexpected weight change.  HENT: Positive for congestion and sore throat. Negative for hearing loss, facial swelling, rhinorrhea, sneezing, drooling, neck pain, neck stiffness, dental problem, postnasal drip and sinus pressure.   Respiratory:  Negative for shortness of breath.   Gastrointestinal: Negative for nausea, vomiting and abdominal pain.  Genitourinary: Negative for dysuria.  Musculoskeletal: Negative for back pain and arthralgias.  Skin: Negative for color change and rash.  Neurological: Negative for headaches.  Hematological: Positive for adenopathy. Does not bruise/bleed easily.    Allergies  Review of patient's allergies indicates no known allergies.  Home Medications   Current Outpatient Rx  Name  Route  Sig  Dispense  Refill  . acetaminophen-codeine (TYLENOL #3) 300-30 MG per tablet   Oral   Take 1-2 tablets by mouth every 6 (six) hours as needed for pain.   15 tablet   0   . azithromycin (ZITHROMAX) 250 MG tablet   Oral   Take 1 tablet (250 mg total) by mouth daily. Take first 2 tablets together, then 1 every day until finished.   6 tablet   0   . norethindrone-ethinyl estradiol (JUNEL FE 1/20) 1-20 MG-MCG per tablet   Oral   Take 1 tablet by mouth daily.             BP 115/77  Pulse 83  Temp(Src) 98.4 F (36.9 C) (Oral)  Resp 16  SpO2 99%  Physical Exam  Nursing note and vitals reviewed. Constitutional: She appears well-developed and well-nourished. No distress.  HENT:  Right Ear: Tympanic membrane normal.  Left Ear: Tympanic membrane normal.  Mouth/Throat: Uvula is midline. Oropharyngeal exudate and posterior oropharyngeal erythema present. No posterior oropharyngeal edema or tonsillar abscesses.  Eyes: Conjunctivae are normal. Pupils are equal, round, and reactive to light.  Neck: Neck supple. No JVD present. No thyromegaly present.  Pulmonary/Chest: Effort normal and breath sounds normal. No stridor.  Lymphadenopathy:    She has cervical adenopathy.  Neurological: She is alert.  Skin: No rash noted. No erythema.    ED Course  Procedures (including critical care time)  Labs Reviewed  POCT RAPID STREP A ([facility] URG CARE ONLY)   No results found.   1. Pharyngitis   2.  Lymphadenitis      Strep negative MDM  Patient with exudative pharyngitis.+ reactive cervical lymphadenopathies. Although this could be a viral process, have encouraged patient to take Tylenol #3 for the first 24-48 hours and if worsening to start with provided anticipatory azithromycin antibiotics. Have also instructed patient to return if she continues to be symptomatic after 5 days of mononucleosis screening test. At this point patient has been symptomatic for less than 48 hours will not screening her for mono close at this point to minimize potential false negative result in        Jimmie Molly, MD 10/19/12 1653

## 2012-10-19 NOTE — ED Notes (Signed)
Usually does not get sick or run a fever; has not felt well for past couple of days; minimal swelling posterior nasopharynx, reddened, no exudate on exam ; relates minimal secretions; NAD, reports feeling feverish

## 2012-12-17 ENCOUNTER — Telehealth: Payer: Self-pay | Admitting: Family Medicine

## 2012-12-17 NOTE — Telephone Encounter (Signed)
Patient has a physical scheduled on 01/20/13 at 8:30.  Patient can't come to the appointment because she has to work.  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  Patient want to know if you would do a physical at 7:30 one morning,so she can come before work.  Please advise.

## 2012-12-17 NOTE — Telephone Encounter (Signed)
Yes ok to accomadate.

## 2012-12-17 NOTE — Telephone Encounter (Signed)
I spoke to patient and she r/s her appointment to 7:30 on 01/20/13.

## 2013-01-20 ENCOUNTER — Encounter: Payer: Self-pay | Admitting: Family Medicine

## 2013-01-20 ENCOUNTER — Encounter: Payer: 59 | Admitting: Family Medicine

## 2013-01-20 ENCOUNTER — Ambulatory Visit (INDEPENDENT_AMBULATORY_CARE_PROVIDER_SITE_OTHER): Payer: 59 | Admitting: Family Medicine

## 2013-01-20 VITALS — BP 120/80 | HR 76 | Temp 97.9°F | Ht 62.0 in | Wt 131.0 lb

## 2013-01-20 DIAGNOSIS — Z111 Encounter for screening for respiratory tuberculosis: Secondary | ICD-10-CM

## 2013-01-20 DIAGNOSIS — Z0000 Encounter for general adult medical examination without abnormal findings: Secondary | ICD-10-CM

## 2013-01-20 DIAGNOSIS — R7611 Nonspecific reaction to tuberculin skin test without active tuberculosis: Secondary | ICD-10-CM

## 2013-01-20 DIAGNOSIS — Z23 Encounter for immunization: Secondary | ICD-10-CM

## 2013-01-20 NOTE — Addendum Note (Signed)
Addended by: Eliezer Bottom on: 01/20/2013 08:30 AM   Modules accepted: Orders

## 2013-01-20 NOTE — Addendum Note (Signed)
Addended by: Alvina Chou on: 01/20/2013 09:24 AM   Modules accepted: Orders

## 2013-01-20 NOTE — Patient Instructions (Addendum)
Good to see you. We will call you with your lab results and can fax them to [facility].

## 2013-01-20 NOTE — Addendum Note (Signed)
Addended by: Alvina Chou on: 01/20/2013 09:14 AM   Modules accepted: Orders

## 2013-01-20 NOTE — Progress Notes (Signed)
22 yo healthy female here for "college CPX."  Attending [employer]- brings in forms with her.  Attending school for dental hygiene. Needs TB skin test and Hep B titers.  Tdap UTD.  Sees GYN, Dr. Dareen Piano. On Junel to regulate her periods. Denies any dysuria, vaginal discharge or other complaints.  Lab Results  Component Value Date   CHOL 178 12/12/2011   HDL 53.70 12/12/2011   LDLCALC 97 12/12/2011   TRIG 135.0 12/12/2011   CHOLHDL 3 12/12/2011       Patient Active Problem List   Diagnosis Date Noted  . Routine general medical examination at a health care facility 12/24/2010   No past medical history on file. No past surgical history on file. History  Substance Use Topics  . Smoking status: Never Smoker   . Smokeless tobacco: Not on file  . Alcohol Use: Not on file   Family History  Problem Relation Age of Onset  . Heart disease Father 38    MI  . Heart disease Maternal Grandfather 40    died of MI   No Known Allergies Current Outpatient Prescriptions on File Prior to Visit  Medication Sig Dispense Refill  . acetaminophen-codeine (TYLENOL #3) 300-30 MG per tablet Take 1-2 tablets by mouth every 6 (six) hours as needed for pain.  15 tablet  0  . norethindrone-ethinyl estradiol (JUNEL FE 1/20) 1-20 MG-MCG per tablet Take 1 tablet by mouth daily.         No current facility-administered medications on file prior to visit.   The PMH, PSH, Social History, Family History, Medications, and allergies have been reviewed in [facility], and have been updated if relevant.  ROS: Patient reports no  vision/ hearing changes,anorexia, weight change, fever ,adenopathy, persistant / recurrent hoarseness, swallowing issues, chest pain, edema,persistant / recurrent cough, hemoptysis, dyspnea(rest, exertional, paroxysmal nocturnal), gastrointestinal  bleeding (melena, rectal bleeding), abdominal pain, excessive heart burn, GU symptoms(dysuria, hematuria, pyuria, voiding/incontinence  Issues) syncope,  focal weakness, severe memory loss, concerning skin lesions, depression, anxiety, abnormal bruising/bleeding, major joint swelling, breast masses or abnormal vaginal bleeding.      Family History  Problem Relation Age of Onset  . Heart disease Father 33    MI  . Heart disease Maternal Grandfather 40    died of MI    No Known Allergies  The PMH, PSH, Social History, Family History, Medications, and allergies have been reviewed in [facility], and have been updated if relevant.  Physical exam: BP 120/80  Pulse 76  Temp(Src) 97.9 F (36.6 C)  Ht 5\' 2"  (1.575 m)  Wt 131 lb (59.421 kg)  BMI 23.95 kg/m2  General:  Well-developed,well-nourished,in no acute distress; alert,appropriate and cooperative throughout examination Head:  normocephalic and atraumatic.   Eyes:  vision grossly intact, pupils equal, pupils round, and pupils reactive to light.   Ears:  R ear normal and L ear normal.   Nose:  no external deformity.   Mouth:  good dentition.   Neck:  No deformities, masses, or tenderness noted. Lungs:  Normal respiratory effort, chest expands symmetrically. Lungs are clear to auscultation, no crackles or wheezes. Heart:  Normal rate and regular rhythm. S1 and S2 normal without gallop, murmur, click, rub or other extra sounds. Abdomen:  Bowel sounds positive,abdomen soft and non-tender without masses, organomegaly or hernias noted. Msk:  No deformity or scoliosis noted of thoracic or lumbar spine.   Extremities:  No clubbing, cyanosis, edema, or deformity noted with normal full range of motion of all  joints.   Neurologic:  alert & oriented X3 and gait normal.   Skin:  Intact without suspicious lesions or rashes Cervical Nodes:  No lymphadenopathy noted Psych:  Cognition and judgment appear intact. Alert and cooperative with normal attention span and concentration. No apparent delusions, illusions, hallucinations   Assessment and Plan:  1. Routine general medical examination at a health  care facility Reviewed preventive care protocols, scheduled due services, and updated immunizations Discussed nutrition, exercise, diet, and healthy lifestyle.  TB skin test given today.  Will check Hep B titers. Orders Placed This Encounter  Procedures  . Hepatitis B surface antigen  . Hepatitis B surface antibody

## 2013-01-21 LAB — VARICELLA ZOSTER ANTIBODY, IGG: Varicella IgG: 259.1 Index — ABNORMAL HIGH (ref <135.00)

## 2013-01-22 ENCOUNTER — Ambulatory Visit (INDEPENDENT_AMBULATORY_CARE_PROVIDER_SITE_OTHER)
Admission: RE | Admit: 2013-01-22 | Discharge: 2013-01-22 | Disposition: A | Discharge status: Home / Self Care | Payer: 59 | Source: Ambulatory Visit | Attending: Family Medicine | Admitting: Family Medicine

## 2013-01-22 DIAGNOSIS — R7611 Nonspecific reaction to tuberculin skin test without active tuberculosis: Secondary | ICD-10-CM

## 2013-01-22 LAB — HEPATITIS B SURFACE ANTIBODY, QUANTITATIVE: Hepatitis B-Post: 0.5 m[IU]/mL

## 2013-01-22 NOTE — Addendum Note (Signed)
Addended by: Dianne Dun on: 01/22/2013 01:47 PM   Modules accepted: Orders

## 2013-01-28 ENCOUNTER — Ambulatory Visit (INDEPENDENT_AMBULATORY_CARE_PROVIDER_SITE_OTHER): Payer: 59 | Admitting: *Deleted

## 2013-01-28 DIAGNOSIS — Z23 Encounter for immunization: Secondary | ICD-10-CM

## 2013-03-02 ENCOUNTER — Ambulatory Visit: Payer: 59

## 2013-03-04 ENCOUNTER — Ambulatory Visit (INDEPENDENT_AMBULATORY_CARE_PROVIDER_SITE_OTHER): Payer: 59 | Admitting: *Deleted

## 2013-03-04 DIAGNOSIS — Z23 Encounter for immunization: Secondary | ICD-10-CM

## 2013-03-23 ENCOUNTER — Encounter: Payer: Self-pay | Admitting: Internal Medicine

## 2013-03-23 ENCOUNTER — Ambulatory Visit (INDEPENDENT_AMBULATORY_CARE_PROVIDER_SITE_OTHER): Payer: 59 | Admitting: Internal Medicine

## 2013-03-23 VITALS — BP 102/72 | HR 76 | Temp 98.0°F | Wt 133.5 lb

## 2013-03-23 DIAGNOSIS — H6980 Other specified disorders of Eustachian tube, unspecified ear: Secondary | ICD-10-CM

## 2013-03-23 DIAGNOSIS — H6981 Other specified disorders of Eustachian tube, right ear: Secondary | ICD-10-CM

## 2013-03-23 DIAGNOSIS — H9209 Otalgia, unspecified ear: Secondary | ICD-10-CM

## 2013-03-23 DIAGNOSIS — H9201 Otalgia, right ear: Secondary | ICD-10-CM

## 2013-03-23 DIAGNOSIS — R42 Dizziness and giddiness: Secondary | ICD-10-CM

## 2013-03-23 MED ORDER — FLUTICASONE PROPIONATE 50 MCG/ACT NA SUSP: 2.0000 | Freq: Every day | NASAL | Status: DC

## 2013-03-23 NOTE — Patient Instructions (Signed)
Barotitis Media Barotitis media is soreness (inflammation) of the area behind the eardrum (middle ear). This occurs when the auditory tube (Eustachian tube) leading from the back of the throat to the eardrum is blocked. When it is blocked air cannot move in and out of the middle ear to equalize pressure changes. These pressure changes come from changes in altitude when:  Flying.  Driving in the mountains.  Diving. Problems are more likely to occur with pressure changes during times when you are congested as from:  Hay fever.  Upper respiratory infection.  A cold. Damage or hearing loss (barotrauma) caused by this may be permanent. HOME CARE INSTRUCTIONS   Use medicines as recommended by your caregiver. Over the counter medicines will help unblock the canal and can help during times of air travel.  Do not put anything into your ears to clean or unplug them. Eardrops will not be helpful.  Do not swim, dive, or fly until your caregiver says it is all right to do so. If these activities are necessary, chewing gum with frequent swallowing may help. It is also helpful to hold your nose and gently blow to pop your ears for equalizing pressure changes. This forces air into the Eustachian tube.  For little ones with problems, give your baby a bottle of water or juice during periods when pressure changes would be anticipated such as during take offs and landings associated with air travel.  Only take over-the-counter or prescription medicines for pain, discomfort, or fever as directed by your caregiver.  A decongestant may be helpful in de-congesting the middle ear and make pressure equalization easier. This can be even more effective if the drops (spray) are delivered with the head lying over the edge of a bed with the head tilted toward the ear on the affected side.  If your caregiver has given you a follow-up appointment, it is very important to keep that appointment. Not keeping the  appointment could result in a chronic or permanent injury, pain, hearing loss and disability. If there is any problem keeping the appointment, you must call back to this facility for assistance. SEEK IMMEDIATE MEDICAL CARE IF:   You develop a severe headache, dizziness, severe ear pain, or bloody or pus-like drainage from your ears.  An oral temperature above 102 F (38.9 C) develops.  Your problems do not improve or become worse. MAKE SURE YOU:   Understand these instructions.  Will watch your condition.  Will get help right away if you are not doing well or get worse. Document Released: 06/14/2000 Document Revised: 09/09/2011 Document Reviewed: 01/21/2008 Exit[employer] Patient Information 2014 [employer].  

## 2013-03-23 NOTE — Progress Notes (Signed)
Subjective:    Patient ID: Hannah Walsh, female    DOB: 1990-08-19, 22 y.o.   MRN: [id number]  HPI  Pt presents to the clinic today with c/o right ear pain. This has been intermittent over the last week. She also c/o intermittent dizziness. It seems to be better if she lays down. She denies trauma to the head or difficulty hearing out of that ear. She did have a URI 1 week ago. She denies fever, chills or body aches. She has not tried anything at home. She denies history of allergies. She has not had sick contacts.  Review of Systems      History reviewed. No pertinent past medical history.  Current Outpatient Prescriptions  Medication Sig Dispense Refill  . acetaminophen-codeine (TYLENOL #3) 300-30 MG per tablet Take 1-2 tablets by mouth every 6 (six) hours as needed for pain.  15 tablet  0  . norethindrone-ethinyl estradiol (JUNEL FE 1/20) 1-20 MG-MCG per tablet Take 1 tablet by mouth daily.         No current facility-administered medications for this visit.    No Known Allergies  Family History  Problem Relation Age of Onset  . Heart disease Father 41    MI  . Heart disease Maternal Grandfather 40    died of MI    History   Social History  . Marital Status: Single    Spouse Name: N/A    Number of Children: N/A  . Years of Education: N/A   Occupational History  . Not on file.   Social History Main Topics  . Smoking status: Never Smoker   . Smokeless tobacco: Not on file  . Alcohol Use: Not on file  . Drug Use: Not on file  . Sexual Activity: Not on file   Other Topics Concern  . Not on file   Social History Narrative   Student at [employer], wants to become a dental hygenist.     Constitutional: Denies fever, malaise, fatigue, headache or abrupt weight changes.  HEENT: Pt reports right ear pain. Denies eye pain, eye redness, ringing in the ears, wax buildup, runny nose, nasal congestion, bloody nose, or sore throat. Respiratory: Denies difficulty breathing,  shortness of breath, cough or sputum production.   Neurological: Pt reports dizziness. Denies  difficulty with memory, difficulty with speech or problems with balance and coordination.   No other specific complaints in a complete review of systems (except as listed in HPI above).  Objective:   Physical Exam   BP 102/72  Pulse 76  Temp(Src) 98 F (36.7 C) (Oral)  Wt 133 lb 8 oz (60.555 kg)  BMI 24.41 kg/m2  SpO2 98% Wt Readings from Last 3 Encounters:  03/23/13 133 lb 8 oz (60.555 kg)  01/20/13 131 lb (59.421 kg)  03/12/12 130 lb (58.968 kg)    General: Appears her stated age, well developed, well nourished in NAD. HEENT: Head: normal shape and size; Eyes: sclera white, no icterus, conjunctiva pink, PERRLA and EOMs intact; Ears: Tm's gray and intact, normal light reflex, + serous effusion right ear; Nose: mucosa pink and moist, septum midline; Throat/Mouth: Teeth present, mucosa pink and moist, no exudate, lesions or ulcerations noted.  Cardiovascular: Normal rate and rhythm. S1,S2 noted.  No murmur, rubs or gallops noted. No JVD or BLE edema. No carotid bruits noted. Pulmonary/Chest: Normal effort and positive vesicular breath sounds. No respiratory distress. No wheezes, rales or ronchi noted.  Neurological: Alert and oriented. Cranial nerves II-XII intact. Coordination  normal. +DTRs bilaterally.       Assessment & Plan:   Eustacian Tube Dysfunction, left ear, likely due to recent URI:  Reassurance given that this will likely resolve with time eRx for flonase to see if this help alleviates symptoms Make position changes slowly to avoid dizzy spells Monitor for fever, chills or worsening pain in ears  RTC as needed or if symptoms persist or worsen

## 2013-04-19 ENCOUNTER — Telehealth: Payer: Self-pay

## 2013-04-19 NOTE — Telephone Encounter (Signed)
Will see then. 

## 2013-04-19 NOTE — Telephone Encounter (Signed)
Pt was seen 03/23/13 with eardrum inflammation; pt still has pain on and off in rt ear. Pt is to fly in airplane on 05/04/13 and scheduled appt with Dr Reece Agar on 04/21/13.

## 2013-04-21 ENCOUNTER — Encounter: Payer: Self-pay | Admitting: Family Medicine

## 2013-04-21 ENCOUNTER — Ambulatory Visit (INDEPENDENT_AMBULATORY_CARE_PROVIDER_SITE_OTHER): Payer: 59 | Admitting: Family Medicine

## 2013-04-21 VITALS — BP 110/80 | HR 72 | Temp 98.6°F | Wt 133.8 lb

## 2013-04-21 DIAGNOSIS — H6982 Other specified disorders of Eustachian tube, left ear: Secondary | ICD-10-CM

## 2013-04-21 DIAGNOSIS — H6980 Other specified disorders of Eustachian tube, unspecified ear: Secondary | ICD-10-CM | POA: Insufficient documentation

## 2013-04-21 NOTE — Progress Notes (Signed)
Pt seen and examined with PA student Ricarda Frame.   Subjective:    Patient ID: Hannah Walsh, female    DOB: March 30, 1991, 22 y.o.   MRN: [id number]  HPI CC: persistent R ear discomfort.  Pt seen by Rene Kocher last month with dx R ear ETD, treated with flonase.   sxs have persisted, and patient wanted re evaluation prrior to her upcoming flight 05/04/2013 to [facility].  Endorses intermittent discomfort of R ear described as pressure sensation associated with imbalance.  Happens once every few days, lasts 30 min.  No cough, congestion, fever, chills.  Mild ST yesterday.  No HA, sinus pressure.  No recent swimming.  No muffled hearing.  Taking flonase regularly - once daily, this is helping.  Hasn't tried anything else for this so far.  Smokers at home - smoke outside.  Some allergic rhinitis sxs this year - stuffy nose.    History reviewed. No pertinent past medical history.   Review of Systems Per HPI    Objective:   Physical Exam  Nursing note and vitals reviewed. Constitutional: She appears well-developed and well-nourished. No distress.  HENT:  Head: Normocephalic and atraumatic.  Right Ear: Hearing, tympanic membrane, external ear and ear canal normal.  Left Ear: Hearing, tympanic membrane, external ear and ear canal normal.  Nose: Nose normal. No mucosal edema or rhinorrhea. Right sinus exhibits no maxillary sinus tenderness and no frontal sinus tenderness. Left sinus exhibits no maxillary sinus tenderness and no frontal sinus tenderness.  Mouth/Throat: Uvula is midline and mucous membranes are normal. Posterior oropharyngeal erythema (slight) present. No oropharyngeal exudate, posterior oropharyngeal edema or tonsillar abscesses.  Good light reflex, pearly grey TMs, no bulge.  Canals clear. No pain.  Eyes: Conjunctivae and EOM are normal. Pupils are equal, round, and reactive to light. No scleral icterus.  Lymphadenopathy:    She has no cervical adenopathy.       Assessment  & Plan:

## 2013-04-21 NOTE — Assessment & Plan Note (Signed)
Anticipate component of ETD on left given sxs endorsed.  Mild. Continue flonase. Discussed prn ibuprofen use, may also try afrin nasal spray for plane rides if needed - but discussed avoiding prolonged use.

## 2013-07-16 ENCOUNTER — Encounter: Payer: Self-pay | Admitting: Family Medicine

## 2013-08-04 ENCOUNTER — Ambulatory Visit: Payer: 59

## 2013-08-05 ENCOUNTER — Ambulatory Visit (INDEPENDENT_AMBULATORY_CARE_PROVIDER_SITE_OTHER): Payer: 59

## 2013-08-05 ENCOUNTER — Telehealth: Payer: Self-pay

## 2013-08-05 DIAGNOSIS — Z23 Encounter for immunization: Secondary | ICD-10-CM

## 2013-08-05 NOTE — Telephone Encounter (Signed)
Spoke to pt and advised per Dr Deborra Medina to RTO in 4-8wks for titer

## 2013-08-05 NOTE — Telephone Encounter (Signed)
Pt received 3rd Hep B vaccination today and pt wants to know when she should have the titer rechecked. Pt request cb.

## 2013-08-05 NOTE — Telephone Encounter (Signed)
Lm on pts vm requesting a call back 

## 2013-08-05 NOTE — Telephone Encounter (Signed)
In 4-8 weeks.

## 2013-09-22 ENCOUNTER — Other Ambulatory Visit: Payer: Self-pay | Admitting: Family Medicine

## 2013-09-22 DIAGNOSIS — Z0000 Encounter for general adult medical examination without abnormal findings: Secondary | ICD-10-CM

## 2013-09-22 DIAGNOSIS — Z136 Encounter for screening for cardiovascular disorders: Secondary | ICD-10-CM

## 2013-09-23 ENCOUNTER — Other Ambulatory Visit (INDEPENDENT_AMBULATORY_CARE_PROVIDER_SITE_OTHER): Payer: 59

## 2013-09-23 ENCOUNTER — Other Ambulatory Visit: Payer: 59

## 2013-09-23 DIAGNOSIS — Z136 Encounter for screening for cardiovascular disorders: Secondary | ICD-10-CM

## 2013-09-23 DIAGNOSIS — Z0000 Encounter for general adult medical examination without abnormal findings: Secondary | ICD-10-CM

## 2013-09-24 ENCOUNTER — Encounter: Payer: Self-pay | Admitting: *Deleted

## 2013-09-24 LAB — COMPREHENSIVE METABOLIC PANEL
ALBUMIN: 4.1 g/dL (ref 3.5–5.2)
ALK PHOS: 40 U/L (ref 39–117)
ALT: 13 U/L (ref 0–35)
AST: 20 U/L (ref 0–37)
BILIRUBIN TOTAL: 0.3 mg/dL (ref 0.3–1.2)
BUN: 12 mg/dL (ref 6–23)
CO2: 28 mEq/L (ref 19–32)
Calcium: 9.5 mg/dL (ref 8.4–10.5)
Chloride: 103 mEq/L (ref 96–112)
Creatinine, Ser: 0.8 mg/dL (ref 0.4–1.2)
GFR: 95.53 mL/min (ref >60.00)
Glucose, Bld: 78 mg/dL (ref 70–99)
POTASSIUM: 4.1 meq/L (ref 3.5–5.1)
SODIUM: 137 meq/L (ref 135–145)
TOTAL PROTEIN: 7.1 g/dL (ref 6.0–8.3)

## 2013-09-24 LAB — CBC WITH DIFFERENTIAL/PLATELET
Basophils Absolute: 0.1 10*3/uL (ref 0.0–0.1)
Basophils Relative: 1.3 % (ref 0.0–3.0)
EOS ABS: 0.3 10*3/uL (ref 0.0–0.7)
Eosinophils Relative: 3.7 % (ref 0.0–5.0)
HEMATOCRIT: 37.7 % (ref 36.0–46.0)
Hemoglobin: 12.7 g/dL (ref 12.0–15.0)
LYMPHS ABS: 3.3 10*3/uL (ref 0.7–4.0)
Lymphocytes Relative: 44.2 % (ref 12.0–46.0)
MCHC: 33.6 g/dL (ref 30.0–36.0)
MCV: 91.8 fl (ref 78.0–100.0)
MONO ABS: 0.5 10*3/uL (ref 0.1–1.0)
Monocytes Relative: 6.6 % (ref 3.0–12.0)
NEUTROS PCT: 44.2 % (ref 43.0–77.0)
Neutro Abs: 3.3 10*3/uL (ref 1.4–7.7)
PLATELETS: 276 10*3/uL (ref 150.0–400.0)
RBC: 4.11 Mil/uL (ref 3.87–5.11)
RDW: 13 % (ref 11.5–14.6)
WBC: 7.4 10*3/uL (ref 4.5–10.5)

## 2013-09-24 LAB — TSH: TSH: 0.37 u[IU]/mL (ref 0.35–5.50)

## 2013-09-24 LAB — LIPID PANEL
CHOL/HDL RATIO: 3
CHOLESTEROL: 165 mg/dL (ref 0–200)
HDL: 51.6 mg/dL (ref >39.00)
LDL CALC: 90 mg/dL (ref 0–99)
TRIGLYCERIDES: 115 mg/dL (ref 0.0–149.0)
VLDL: 23 mg/dL (ref 0.0–40.0)

## 2013-10-05 ENCOUNTER — Telehealth: Payer: Self-pay

## 2013-10-05 NOTE — Telephone Encounter (Signed)
Pt left v/m; pt received lab results in mail and pt said she needed hep B titer. Pt request cb.

## 2013-10-06 NOTE — Telephone Encounter (Signed)
Lm on pts vm informing her to RTO to have Hep B titer completed

## 2013-10-06 NOTE — Telephone Encounter (Signed)
Pt left v/m; pt got message and pt said she was supposed to have hep B titer done when 09/23/13 labs done; pt wants cb to understand why Hep B not tested in March.

## 2013-10-06 NOTE — Telephone Encounter (Signed)
Lm on pts vm requesting a call back 

## 2013-10-06 NOTE — Telephone Encounter (Signed)
Please ask her to come for hep B titer.

## 2013-10-07 NOTE — Telephone Encounter (Signed)
Lm on pts vm requesting a call back 

## 2013-10-07 NOTE — Telephone Encounter (Signed)
Pt left v/m requesting [phone].

## 2013-10-08 ENCOUNTER — Other Ambulatory Visit (INDEPENDENT_AMBULATORY_CARE_PROVIDER_SITE_OTHER): Payer: 59

## 2013-10-08 DIAGNOSIS — Z9189 Other specified personal risk factors, not elsewhere classified: Secondary | ICD-10-CM

## 2013-10-09 LAB — HEPATITIS B SURFACE ANTIBODY, QUANTITATIVE: Hepatitis B-Post: 543 m[IU]/mL

## 2013-10-11 NOTE — Telephone Encounter (Signed)
Pt received titer which was positive so immunization not required. Lm on pts vm informing.

## 2014-01-05 ENCOUNTER — Ambulatory Visit: Payer: 59 | Admitting: Family Medicine

## 2015-02-13 ENCOUNTER — Telehealth: Payer: Self-pay

## 2015-02-13 NOTE — Telephone Encounter (Signed)
i was not here in 12/2012 and based on her chart, I dont show that she ever came back to have it read. See lab results

## 2015-02-13 NOTE — Telephone Encounter (Signed)
This pt has not had a ppd since 12/2012

## 2015-02-13 NOTE — Telephone Encounter (Signed)
I apologize pt is questioning 01/22/13 Millimeter reading.

## 2015-02-13 NOTE — Telephone Encounter (Addendum)
Pt left v/m requesting cb with TB skin test read on 01/22/13;pt wants to know what the millimeters were; this is for a work paper. I do not see MM listed.Please advise.

## 2015-02-14 NOTE — Telephone Encounter (Signed)
Pt left v/m requesting MM of TB skin test reading of 12/2012. 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 Please advise.

## 2015-02-14 NOTE — Telephone Encounter (Signed)
Will route to [place], RN lead.

## 2015-02-15 ENCOUNTER — Other Ambulatory Visit: Payer: Self-pay | Admitting: Family Medicine

## 2015-02-15 DIAGNOSIS — Z0000 Encounter for general adult medical examination without abnormal findings: Secondary | ICD-10-CM

## 2015-02-16 NOTE — Telephone Encounter (Signed)
I do not see any documentation of PPD reading or induration noted.  Left message for patient to return call.

## 2015-02-24 ENCOUNTER — Other Ambulatory Visit: Payer: Self-pay

## 2015-02-24 ENCOUNTER — Other Ambulatory Visit (INDEPENDENT_AMBULATORY_CARE_PROVIDER_SITE_OTHER): Payer: 59

## 2015-02-24 DIAGNOSIS — Z0000 Encounter for general adult medical examination without abnormal findings: Secondary | ICD-10-CM

## 2015-02-24 LAB — CBC WITH DIFFERENTIAL/PLATELET
BASOS ABS: 0 10*3/uL (ref 0.0–0.1)
Basophils Relative: 0.5 % (ref 0.0–3.0)
Eosinophils Absolute: 0.1 10*3/uL (ref 0.0–0.7)
Eosinophils Relative: 1.4 % (ref 0.0–5.0)
HCT: 38.5 % (ref 36.0–46.0)
Hemoglobin: 13 g/dL (ref 12.0–15.0)
LYMPHS ABS: 3 10*3/uL (ref 0.7–4.0)
Lymphocytes Relative: 35.6 % (ref 12.0–46.0)
MCHC: 33.7 g/dL (ref 30.0–36.0)
MCV: 90.8 fl (ref 78.0–100.0)
MONO ABS: 0.4 10*3/uL (ref 0.1–1.0)
Monocytes Relative: 4.9 % (ref 3.0–12.0)
NEUTROS ABS: 4.8 10*3/uL (ref 1.4–7.7)
NEUTROS PCT: 57.6 % (ref 43.0–77.0)
PLATELETS: 282 10*3/uL (ref 150.0–400.0)
RBC: 4.25 Mil/uL (ref 3.87–5.11)
RDW: 12.7 % (ref 11.5–15.5)
WBC: 8.3 10*3/uL (ref 4.0–10.5)

## 2015-02-24 LAB — COMPREHENSIVE METABOLIC PANEL
ALK PHOS: 32 U/L — AB (ref 39–117)
ALT: 13 U/L (ref 0–35)
AST: 16 U/L (ref 0–37)
Albumin: 4.2 g/dL (ref 3.5–5.2)
BILIRUBIN TOTAL: 0.4 mg/dL (ref 0.2–1.2)
BUN: 10 mg/dL (ref 6–23)
CO2: 27 meq/L (ref 19–32)
Calcium: 9.7 mg/dL (ref 8.4–10.5)
Chloride: 101 mEq/L (ref 96–112)
Creatinine, Ser: 0.67 mg/dL (ref 0.40–1.20)
GFR: 115.73 mL/min (ref >60.00)
GLUCOSE: 75 mg/dL (ref 70–99)
POTASSIUM: 4 meq/L (ref 3.5–5.1)
SODIUM: 135 meq/L (ref 135–145)
TOTAL PROTEIN: 7.5 g/dL (ref 6.0–8.3)

## 2015-02-24 LAB — LIPID PANEL
CHOL/HDL RATIO: 3
Cholesterol: 164 mg/dL (ref 0–200)
HDL: 52.9 mg/dL (ref >39.00)
LDL Cholesterol: 91 mg/dL (ref 0–99)
NONHDL: 110.79
Triglycerides: 98 mg/dL (ref 0.0–149.0)
VLDL: 19.6 mg/dL (ref 0.0–40.0)

## 2015-02-24 LAB — TSH: TSH: 1.52 u[IU]/mL (ref 0.35–4.50)

## 2015-02-28 ENCOUNTER — Other Ambulatory Visit (HOSPITAL_COMMUNITY)
Admission: RE | Admit: 2015-02-28 | Discharge: 2015-02-28 | Disposition: A | Discharge status: Home / Self Care | Payer: 59 | Source: Ambulatory Visit | Attending: Family Medicine | Admitting: Family Medicine

## 2015-02-28 ENCOUNTER — Ambulatory Visit (INDEPENDENT_AMBULATORY_CARE_PROVIDER_SITE_OTHER): Payer: 59 | Admitting: Family Medicine

## 2015-02-28 ENCOUNTER — Encounter: Payer: Self-pay | Admitting: Family Medicine

## 2015-02-28 VITALS — BP 118/70 | HR 93 | Temp 97.9°F | Ht 61.5 in | Wt 133.2 lb

## 2015-02-28 DIAGNOSIS — Z01419 Encounter for gynecological examination (general) (routine) without abnormal findings: Secondary | ICD-10-CM | POA: Diagnosis present

## 2015-02-28 DIAGNOSIS — N760 Acute vaginitis: Secondary | ICD-10-CM | POA: Insufficient documentation

## 2015-02-28 DIAGNOSIS — Z0000 Encounter for general adult medical examination without abnormal findings: Secondary | ICD-10-CM

## 2015-02-28 DIAGNOSIS — Z113 Encounter for screening for infections with a predominantly sexual mode of transmission: Secondary | ICD-10-CM | POA: Diagnosis present

## 2015-02-28 DIAGNOSIS — Z1151 Encounter for screening for human papillomavirus (HPV): Secondary | ICD-10-CM | POA: Insufficient documentation

## 2015-02-28 MED ORDER — NORETHIN ACE-ETH ESTRAD-FE 1-20 MG-MCG PO TABS: 1.0000 | ORAL_TABLET | Freq: Every day | ORAL | Status: DC

## 2015-02-28 NOTE — Progress Notes (Signed)
Subjective:   Patient ID: Hannah Walsh, female    DOB: Nov 06, 1990, 24 y.o.   MRN: [id number]  Hannah Walsh is a pleasant 24 y.o. year old female who presents to clinic today with Annual Exam  on 02/28/2015  HPI:  Was seen GYN, Dr. Ouida Sills but would like for me to take over her GYN care.  On Junel to regulate her periods.   Lab Results  Component Value Date   WBC 8.3 02/24/2015   HGB 13.0 02/24/2015   HCT 38.5 02/24/2015   MCV 90.8 02/24/2015   PLT 282.0 02/24/2015   Lab Results  Component Value Date   CREATININE 0.67 02/24/2015   Lab Results  Component Value Date   NA 135 02/24/2015   K 4.0 02/24/2015   CL 101 02/24/2015   CO2 27 02/24/2015   Lab Results  Component Value Date   CHOL 164 02/24/2015   HDL 52.90 02/24/2015   LDLCALC 91 02/24/2015   TRIG 98.0 02/24/2015   CHOLHDL 3 02/24/2015   Lab Results  Component Value Date   ALT 13 02/24/2015   AST 16 02/24/2015   ALKPHOS 32* 02/24/2015   BILITOT 0.4 02/24/2015   Current Outpatient Prescriptions on File Prior to Visit  Medication Sig Dispense Refill  . norethindrone-ethinyl estradiol (JUNEL FE 1/20) 1-20 MG-MCG per tablet Take 1 tablet by mouth daily.       No current facility-administered medications on file prior to visit.    No Known Allergies  No past medical history on file.  No past surgical history on file.  Family History  Problem Relation Age of Onset  . Heart disease Father 2    MI  . Heart disease Maternal Grandfather 64    died of MI    Social History   Social History  . Marital Status: Single    Spouse Name: N/A  . Number of Children: N/A  . Years of Education: N/A   Occupational History  . Not on file.   Social History Main Topics  . Smoking status: Never Smoker   . Smokeless tobacco: Not on file  . Alcohol Use: No  . Drug Use: No  . Sexual Activity: Not on file   Other Topics Concern  . Not on file   Social History Narrative   Student at [employer], wants to  become a dental hygenist.  The PMH, PSH, Social History, Family History, Medications, and allergies have been reviewed in [facility], and have been updated if relevant.   Review of Systems  Constitutional: Negative.   HENT: Negative.   Eyes: Negative.   Respiratory: Negative.   Cardiovascular: Negative.   Gastrointestinal: Negative.   Endocrine: Negative.   Genitourinary: Negative.   Musculoskeletal: Negative.   Skin: Negative.   Allergic/Immunologic: Negative.   Neurological: Negative.   Hematological: Negative.   Psychiatric/Behavioral: Negative.   All other systems reviewed and are negative.      Objective:    BP 118/70 mmHg  Pulse 93  Temp(Src) 97.9 F (36.6 C) (Oral)  Ht 5' 1.5" (1.562 m)  Wt 133 lb 4 oz (60.442 kg)  BMI 24.77 kg/m2  SpO2 99%  Wt Readings from Last 3 Encounters:  02/28/15 133 lb 4 oz (60.442 kg)  04/21/13 133 lb 12 oz (60.669 kg)  03/23/13 133 lb 8 oz (60.555 kg)     Physical Exam   General:  Well-developed,well-nourished,in no acute distress; alert,appropriate and cooperative throughout examination Head:  normocephalic and atraumatic.  Eyes:  vision grossly intact, pupils equal, pupils round, and pupils reactive to light.   Ears:  R ear normal and L ear normal.   Nose:  no external deformity.   Mouth:  good dentition.   Neck:  No deformities, masses, or tenderness noted. Breasts:  No mass, nodules, thickening, tenderness, bulging, retraction, inflamation, nipple discharge or skin changes noted.   Lungs:  Normal respiratory effort, chest expands symmetrically. Lungs are clear to auscultation, no crackles or wheezes. Heart:  Normal rate and regular rhythm. S1 and S2 normal without gallop, murmur, click, rub or other extra sounds. Abdomen:  Bowel sounds positive,abdomen soft and non-tender without masses, organomegaly or hernias noted. Rectal:  no external abnormalities.   Genitalia:  Pelvic Exam:        External: normal female genitalia without  lesions or masses        Vagina: normal without lesions or masses        Cervix: normal without lesions or masses        Adnexa: normal bimanual exam without masses or fullness        Uterus: normal by palpation        Pap smear: performed Msk:  No deformity or scoliosis noted of thoracic or lumbar spine.   Extremities:  No clubbing, cyanosis, edema, or deformity noted with normal full range of motion of all joints.   Neurologic:  alert & oriented X3 and gait normal.   Skin:  Intact without suspicious lesions or rashes Cervical Nodes:  No lymphadenopathy noted Axillary Nodes:  No palpable lymphadenopathy Psych:  Cognition and judgment appear intact. Alert and cooperative with normal attention span and concentration. No apparent delusions, illusions, hallucinations       Assessment & Plan:   Routine general medical examination at a health care facility No Follow-up on file.

## 2015-02-28 NOTE — Addendum Note (Signed)
Addended by: Modena Nunnery on: 02/28/2015 07:54 AM   Modules accepted: Orders

## 2015-02-28 NOTE — Assessment & Plan Note (Signed)
Reviewed preventive care protocols, scheduled due services, and updated immunizations Discussed nutrition, exercise, diet, and healthy lifestyle.  Pap smear done today.  eRx sent for Junel refills.

## 2015-02-28 NOTE — Progress Notes (Signed)
Pre visit review using our clinic review tool, if applicable. No additional management support is needed unless otherwise documented below in the visit note. 

## 2015-03-01 LAB — CYTOLOGY - PAP

## 2015-03-02 ENCOUNTER — Encounter: Payer: Self-pay | Admitting: *Deleted

## 2015-03-03 LAB — CERVICOVAGINAL ANCILLARY ONLY
Bacterial vaginitis: NEGATIVE
CANDIDA VAGINITIS: NEGATIVE

## 2015-03-07 LAB — CERVICOVAGINAL ANCILLARY ONLY: HERPES (WINDOWPATH): NEGATIVE

## 2015-03-31 ENCOUNTER — Telehealth: Payer: Self-pay

## 2015-03-31 NOTE — Telephone Encounter (Signed)
Pt said Junel is too expensive and request tier one drug to substitute for Junel. Advised pt she will need to ck with ins co to see what is approved as tier one drug and pt will cb with info later on.

## 2015-04-28 ENCOUNTER — Other Ambulatory Visit: Payer: Self-pay | Admitting: *Deleted

## 2015-04-28 MED ORDER — NORETHIN ACE-ETH ESTRAD-FE 1-20 MG-MCG PO TABS: 1.0000 | ORAL_TABLET | Freq: Every day | ORAL | Status: DC

## 2015-04-28 NOTE — Telephone Encounter (Signed)
Pt left message at Triage, requesting Rx to be sent to [facility], done and pt notified and I [facility]

## 2015-06-21 ENCOUNTER — Ambulatory Visit (INDEPENDENT_AMBULATORY_CARE_PROVIDER_SITE_OTHER): Payer: 59 | Admitting: Family Medicine

## 2015-06-21 ENCOUNTER — Encounter: Payer: Self-pay | Admitting: Family Medicine

## 2015-06-21 VITALS — BP 114/80 | HR 85 | Temp 98.1°F | Ht 61.0 in | Wt 137.0 lb

## 2015-06-21 DIAGNOSIS — J028 Acute pharyngitis due to other specified organisms: Principal | ICD-10-CM

## 2015-06-21 DIAGNOSIS — J029 Acute pharyngitis, unspecified: Secondary | ICD-10-CM

## 2015-06-21 DIAGNOSIS — B9789 Other viral agents as the cause of diseases classified elsewhere: Principal | ICD-10-CM

## 2015-06-21 LAB — POCT RAPID STREP A (OFFICE): Rapid Strep A Screen: NEGATIVE

## 2015-06-21 NOTE — Assessment & Plan Note (Signed)
RST negative Anticipate viral given short duration. No evidence of bacterial infection today. Discussed supportive care as per instructions. Discussed advil use.  Update if not improving as expected.

## 2015-06-21 NOTE — Addendum Note (Signed)
Addended by: Cloyd Stagers B on: 06/21/2015 11:34 AM   Modules accepted: Orders, SmartSet

## 2015-06-21 NOTE — Progress Notes (Signed)
   BP 114/80 mmHg  Pulse 85  Temp(Src) 98.1 F (36.7 C) (Oral)  Ht 5\' 1"  (1.549 m)  Wt 137 lb (62.143 kg)  BMI 25.90 kg/m2  SpO2 98%   CC: ST  Subjective:    Patient ID: Hannah Walsh, female    DOB: Jul 19, 1990, 24 y.o.   MRN: [id number]  HPI: Hannah Walsh is a 24 y.o. female presenting on 06/21/2015 for Sore Throat   4d h/o ST associated with hoarse voice and chest congestion. + productive cough. Tuesday morning ST woke her up from sleep at 2am. Feverish yesterday. + R lower teeth aching last night. Some body aches. + PNDrainage.  Has been treating with mucinex fast max relief and nyquil last night. Also taking advil. Recently started on doxycycline 100mg  daily x 30d course by dermatologist. On OCP.  Fiance sick last week. No h/o asthma.  Dad smokes at home.   No chills, ear pain, headache, abd pain, nausea. No dyspnea or wheezing.   Relevant past medical, surgical, family and social history reviewed and updated as indicated. Interim medical history since our last visit reviewed. Allergies and medications reviewed and updated. Current Outpatient Prescriptions on File Prior to Visit  Medication Sig  . norethindrone-ethinyl estradiol (JUNEL FE,GILDESS FE,LOESTRIN FE) 1-20 MG-MCG tablet Take 1 tablet by mouth daily.   No current facility-administered medications on file prior to visit.    Review of Systems Per HPI unless specifically indicated in ROS section     Objective:    BP 114/80 mmHg  Pulse 85  Temp(Src) 98.1 F (36.7 C) (Oral)  Ht 5\' 1"  (1.549 m)  Wt 137 lb (62.143 kg)  BMI 25.90 kg/m2  SpO2 98%  Wt Readings from Last 3 Encounters:  06/21/15 137 lb (62.143 kg)  02/28/15 133 lb 4 oz (60.442 kg)  04/21/13 133 lb 12 oz (60.669 kg)    Physical Exam  Constitutional: She appears well-developed and well-nourished. No distress.  HENT:  Head: Normocephalic and atraumatic.  Right Ear: Hearing, tympanic membrane, external ear and ear canal normal.  Left Ear:  Hearing, tympanic membrane, external ear and ear canal normal.  Nose: Mucosal edema (with nasal inflammation) present. No rhinorrhea. Right sinus exhibits no maxillary sinus tenderness and no frontal sinus tenderness. Left sinus exhibits no maxillary sinus tenderness and no frontal sinus tenderness.  Mouth/Throat: Uvula is midline and mucous membranes are normal. Posterior oropharyngeal edema and posterior oropharyngeal erythema present. No oropharyngeal exudate or tonsillar abscesses.  No trismus or pain at TMJ.  Eyes: Conjunctivae and EOM are normal. Pupils are equal, round, and reactive to light. No scleral icterus.  Neck: Normal range of motion. Neck supple. No thyromegaly present.  Cardiovascular: Normal rate, regular rhythm, normal heart sounds and intact distal pulses.   No murmur heard. Pulmonary/Chest: Effort normal and breath sounds normal. No respiratory distress. She has no wheezes. She has no rales.  Lymphadenopathy:    She has no cervical adenopathy.  Skin: Skin is warm and dry. No rash noted.  Nursing note and vitals reviewed.      Assessment & Plan:   Problem List Items Addressed This Visit    Acute viral pharyngitis - Primary    RST negative Anticipate viral given short duration. No evidence of bacterial infection today. Discussed supportive care as per instructions. Discussed advil use.  Update if not improving as expected.           Follow up plan: No Follow-up on file.

## 2015-06-21 NOTE — Patient Instructions (Signed)
You have acute viral pharyngitis. Push fluids and plenty of rest. May use ibuprofen 400mg  with food up to three times daily for throat inflammation. Salt water gargles. Suck on cold things like popsicles or warm things like herbal teas (whichever soothes the throat better). Return if fever >101.5, worsening pain, or trouble opening/closing mouth, or hoarse voice. Good to see you today, call clinic with questions.

## 2015-11-25 ENCOUNTER — Other Ambulatory Visit: Payer: Self-pay | Admitting: Family Medicine

## 2016-02-10 ENCOUNTER — Other Ambulatory Visit: Payer: Self-pay | Admitting: Family Medicine

## 2016-04-05 ENCOUNTER — Other Ambulatory Visit: Payer: Self-pay

## 2016-04-05 MED ORDER — NORETHIN ACE-ETH ESTRAD-FE 1-20 MG-MCG PO TABS: 1.0000 | ORAL_TABLET | Freq: Every day | ORAL | 0 refills | Status: DC

## 2016-04-05 NOTE — Telephone Encounter (Signed)
Pt has already scheduled CPX on 05/01/16. Pt has not missed any BC pills but pt will be out of med on 04/06/16. [facility] to [facility] and pt will keep CPX appt. Pt voiced understanding.

## 2016-04-17 ENCOUNTER — Other Ambulatory Visit: Payer: Self-pay | Admitting: Family Medicine

## 2016-04-17 DIAGNOSIS — Z0000 Encounter for general adult medical examination without abnormal findings: Secondary | ICD-10-CM

## 2016-04-26 ENCOUNTER — Other Ambulatory Visit (INDEPENDENT_AMBULATORY_CARE_PROVIDER_SITE_OTHER): Payer: 59

## 2016-04-26 DIAGNOSIS — Z23 Encounter for immunization: Secondary | ICD-10-CM | POA: Diagnosis not present

## 2016-04-26 DIAGNOSIS — Z0000 Encounter for general adult medical examination without abnormal findings: Secondary | ICD-10-CM

## 2016-04-26 LAB — CBC WITH DIFFERENTIAL/PLATELET
Basophils Absolute: 0 10*3/uL (ref 0.0–0.1)
Basophils Relative: 0.6 % (ref 0.0–3.0)
EOS PCT: 1.5 % (ref 0.0–5.0)
Eosinophils Absolute: 0.1 10*3/uL (ref 0.0–0.7)
HEMATOCRIT: 38.3 % (ref 36.0–46.0)
HEMOGLOBIN: 13.1 g/dL (ref 12.0–15.0)
Lymphocytes Relative: 35.8 % (ref 12.0–46.0)
Lymphs Abs: 2.7 10*3/uL (ref 0.7–4.0)
MCHC: 34.3 g/dL (ref 30.0–36.0)
MCV: 89 fl (ref 78.0–100.0)
MONOS PCT: 6.1 % (ref 3.0–12.0)
Monocytes Absolute: 0.5 10*3/uL (ref 0.1–1.0)
Neutro Abs: 4.2 10*3/uL (ref 1.4–7.7)
Neutrophils Relative %: 56 % (ref 43.0–77.0)
Platelets: 283 10*3/uL (ref 150.0–400.0)
RBC: 4.31 Mil/uL (ref 3.87–5.11)
RDW: 12.6 % (ref 11.5–15.5)
WBC: 7.6 10*3/uL (ref 4.0–10.5)

## 2016-04-26 LAB — LIPID PANEL
CHOLESTEROL: 154 mg/dL (ref 0–200)
HDL: 53.9 mg/dL (ref >39.00)
LDL Cholesterol: 76 mg/dL (ref 0–99)
NonHDL: 100.22
Total CHOL/HDL Ratio: 3
Triglycerides: 121 mg/dL (ref 0.0–149.0)
VLDL: 24.2 mg/dL (ref 0.0–40.0)

## 2016-04-26 LAB — COMPREHENSIVE METABOLIC PANEL
ALBUMIN: 4.2 g/dL (ref 3.5–5.2)
ALK PHOS: 30 U/L — AB (ref 39–117)
ALT: 11 U/L (ref 0–35)
AST: 15 U/L (ref 0–37)
BUN: 8 mg/dL (ref 6–23)
CALCIUM: 9.7 mg/dL (ref 8.4–10.5)
CO2: 27 mEq/L (ref 19–32)
Chloride: 102 mEq/L (ref 96–112)
Creatinine, Ser: 0.7 mg/dL (ref 0.40–1.20)
GFR: 108.93 mL/min (ref >60.00)
Glucose, Bld: 88 mg/dL (ref 70–99)
POTASSIUM: 4.1 meq/L (ref 3.5–5.1)
Sodium: 135 mEq/L (ref 135–145)
TOTAL PROTEIN: 7.4 g/dL (ref 6.0–8.3)
Total Bilirubin: 0.5 mg/dL (ref 0.2–1.2)

## 2016-04-26 NOTE — Addendum Note (Signed)
Addended by: Daralene Milch C on: 04/26/2016 10:05 AM   Modules accepted: Orders

## 2016-04-29 LAB — TSH: TSH: 1.03 u[IU]/mL (ref 0.35–4.50)

## 2016-05-01 ENCOUNTER — Encounter (INDEPENDENT_AMBULATORY_CARE_PROVIDER_SITE_OTHER): Payer: Self-pay

## 2016-05-01 ENCOUNTER — Encounter: Payer: Self-pay | Admitting: Family Medicine

## 2016-05-01 ENCOUNTER — Ambulatory Visit (INDEPENDENT_AMBULATORY_CARE_PROVIDER_SITE_OTHER): Payer: 59 | Admitting: Family Medicine

## 2016-05-01 ENCOUNTER — Other Ambulatory Visit (HOSPITAL_COMMUNITY)
Admission: RE | Admit: 2016-05-01 | Discharge: 2016-05-01 | Disposition: A | Discharge status: Home / Self Care | Payer: 59 | Source: Ambulatory Visit | Attending: Family Medicine | Admitting: Family Medicine

## 2016-05-01 VITALS — BP 118/86 | HR 85 | Temp 98.7°F | Ht 62.0 in | Wt 134.0 lb

## 2016-05-01 DIAGNOSIS — Z1151 Encounter for screening for human papillomavirus (HPV): Secondary | ICD-10-CM | POA: Insufficient documentation

## 2016-05-01 DIAGNOSIS — Z0000 Encounter for general adult medical examination without abnormal findings: Secondary | ICD-10-CM

## 2016-05-01 DIAGNOSIS — Z01419 Encounter for gynecological examination (general) (routine) without abnormal findings: Secondary | ICD-10-CM | POA: Diagnosis present

## 2016-05-01 MED ORDER — NORETHIN ACE-ETH ESTRAD-FE 1-20 MG-MCG PO TABS: 1.0000 | ORAL_TABLET | Freq: Every day | ORAL | 0 refills | Status: DC

## 2016-05-01 MED ORDER — NORETHIN ACE-ETH ESTRAD-FE 1-20 MG-MCG PO TABS: 1.0000 | ORAL_TABLET | Freq: Every day | ORAL | 6 refills | Status: DC

## 2016-05-01 NOTE — Progress Notes (Signed)
Subjective:   Patient ID: Hannah Walsh, female    DOB: 08-14-1990, 25 y.o.   MRN: [id number]  MARIBETH BELTZ is a pleasant 25 y.o. year old female who presents to clinic today with Annual Exam  on 05/01/2016  HPI:  Last pap smear done by me on 02/28/15- normal.  On Junel to regulate her periods.  No complaints today. Lab Results  Component Value Date   WBC 7.6 04/26/2016   HGB 13.1 04/26/2016   HCT 38.3 04/26/2016   MCV 89.0 04/26/2016   PLT 283.0 04/26/2016   Lab Results  Component Value Date   CREATININE 0.70 04/26/2016   Lab Results  Component Value Date   NA 135 04/26/2016   K 4.1 04/26/2016   CL 102 04/26/2016   CO2 27 04/26/2016   Lab Results  Component Value Date   CHOL 154 04/26/2016   HDL 53.90 04/26/2016   LDLCALC 76 04/26/2016   TRIG 121.0 04/26/2016   CHOLHDL 3 04/26/2016   Lab Results  Component Value Date   ALT 11 04/26/2016   AST 15 04/26/2016   ALKPHOS 30 (L) 04/26/2016   BILITOT 0.5 04/26/2016   Current Outpatient Prescriptions on File Prior to Visit  Medication Sig Dispense Refill  . norethindrone-ethinyl estradiol (BLISOVI FE 1/20) 1-20 MG-MCG tablet Take 1 tablet by mouth daily. COMPLETE PHYSICAL EXAM REQUIRED FOR ADDITIONAL REFILLS 28 tablet 0   No current facility-administered medications on file prior to visit.     No Known Allergies  No past medical history on file.  No past surgical history on file.  Family History  Problem Relation Age of Onset  . Heart disease Father 69    MI  . Heart disease Maternal Grandfather 62    died of MI    Social History   Social History  . Marital status: Single    Spouse name: N/A  . Number of children: N/A  . Years of education: N/A   Occupational History  . Not on file.   Social History Main Topics  . Smoking status: Never Smoker  . Smokeless tobacco: Not on file  . Alcohol use No  . Drug use: No  . Sexual activity: Not on file   Other Topics Concern  . Not on file   Social  History Narrative   Student at [employer], wants to become a dental hygenist.  The PMH, PSH, Social History, Family History, Medications, and allergies have been reviewed in [facility], and have been updated if relevant.   Review of Systems  Constitutional: Negative.   HENT: Negative.   Eyes: Negative.   Respiratory: Negative.   Cardiovascular: Negative.   Gastrointestinal: Negative.   Endocrine: Negative.   Genitourinary: Negative.   Musculoskeletal: Negative.   Skin: Negative.   Allergic/Immunologic: Negative.   Neurological: Negative.   Hematological: Negative.   Psychiatric/Behavioral: Negative.   All other systems reviewed and are negative.      Objective:    BP 118/86 (BP Location: Left Arm, Patient Position: Sitting, Cuff Size: Normal)   Pulse 85   Temp 98.7 F (37.1 C) (Oral)   Ht 5\' 2"  (1.575 m)   Wt 134 lb (60.8 kg)   LMP 03/31/2016 (Approximate)   SpO2 97%   BMI 24.51 kg/m   Wt Readings from Last 3 Encounters:  05/01/16 134 lb (60.8 kg)  06/21/15 137 lb (62.1 kg)  02/28/15 133 lb 4 oz (60.4 kg)     Physical Exam   General:  Well-developed,well-nourished,in no acute distress; alert,appropriate and cooperative throughout examination Head:  normocephalic and atraumatic.   Eyes:  vision grossly intact, pupils equal, pupils round, and pupils reactive to light.   Ears:  R ear normal and L ear normal.   Nose:  no external deformity.   Mouth:  good dentition.   Neck:  No deformities, masses, or tenderness noted. Breasts:  No mass, nodules, thickening, tenderness, bulging, retraction, inflamation, nipple discharge or skin changes noted.   Lungs:  Normal respiratory effort, chest expands symmetrically. Lungs are clear to auscultation, no crackles or wheezes. Heart:  Normal rate and regular rhythm. S1 and S2 normal without gallop, murmur, click, rub or other extra sounds. Abdomen:  Bowel sounds positive,abdomen soft and non-tender without masses, organomegaly or hernias  noted. Rectal:  no external abnormalities.   Genitalia:  Pelvic Exam:        External: normal female genitalia without lesions or masses        Vagina: normal without lesions or masses        Cervix: normal without lesions or masses        Adnexa: normal bimanual exam without masses or fullness        Uterus: normal by palpation        Pap smear: performed Msk:  No deformity or scoliosis noted of thoracic or lumbar spine.   Extremities:  No clubbing, cyanosis, edema, or deformity noted with normal full range of motion of all joints.   Neurologic:  alert & oriented X3 and gait normal.   Skin:  Intact without suspicious lesions or rashes Cervical Nodes:  No lymphadenopathy noted Axillary Nodes:  No palpable lymphadenopathy Psych:  Cognition and judgment appear intact. Alert and cooperative with normal attention span and concentration. No apparent delusions, illusions, hallucinations       Assessment & Plan:   Routine general medical examination at a health care facility No Follow-up on file.

## 2016-05-01 NOTE — Assessment & Plan Note (Signed)
Reviewed preventive care protocols, scheduled due services, and updated immunizations Discussed nutrition, exercise, diet, and healthy lifestyle.  Discussed USPSTF recommendations of cervical cancer screening.  She is aware that interval of 3 years is recommended but pt would prefer to have pap smear done today.  

## 2016-05-01 NOTE — Progress Notes (Signed)
Pre visit review using our clinic review tool, if applicable. No additional management support is needed unless otherwise documented below in the visit note. 

## 2016-05-02 ENCOUNTER — Encounter: Payer: Self-pay | Admitting: *Deleted

## 2016-05-02 LAB — CYTOLOGY - PAP
Diagnosis: NEGATIVE
HPV: NOT DETECTED

## 2016-05-03 ENCOUNTER — Other Ambulatory Visit: Payer: Self-pay | Admitting: Family Medicine

## 2016-05-03 NOTE — Telephone Encounter (Signed)
Pt request status of refill for Junel Fe refill to [facility]. I spoke with Nicki Reaper at [facility] and [facility] and rx ready for pick up today in 1 1/2 hours and Scott request pt to bring new ins card. Pt voiced understanding.

## 2016-05-15 ENCOUNTER — Other Ambulatory Visit: Payer: Self-pay

## 2016-05-15 MED ORDER — NORETHIN ACE-ETH ESTRAD-FE 1-20 MG-MCG PO TABS: 1.0000 | ORAL_TABLET | Freq: Every day | ORAL | 3 refills | Status: DC

## 2016-05-15 NOTE — Telephone Encounter (Signed)
Pt request BC pill sent [facility] instead of local [facility]. Last annual 05/01/16. [facility] notified to d/c rx. rx sent to [facility]. Advised pt done.

## 2016-05-17 NOTE — Telephone Encounter (Signed)
Pt said she spoke with [facility] and they did not receive refill on 05/15/16 and I am to call [facility] at [phone]. I called and spoke with Tom a pharmacist; Gershon Mussel said they did receive refill on 05/15/16 but need ins coverage verified for payment. Pt to call [facility] at [phone] and use ref # [id number]. Pt voiced understanding.

## 2016-12-02 ENCOUNTER — Encounter: Payer: Self-pay | Admitting: Family Medicine

## 2016-12-04 ENCOUNTER — Encounter: Payer: Self-pay | Admitting: Family Medicine

## 2016-12-04 ENCOUNTER — Ambulatory Visit (INDEPENDENT_AMBULATORY_CARE_PROVIDER_SITE_OTHER): Payer: 59 | Admitting: Family Medicine

## 2016-12-04 DIAGNOSIS — W57XXXA Bitten or stung by nonvenomous insect and other nonvenomous arthropods, initial encounter: Secondary | ICD-10-CM

## 2016-12-04 DIAGNOSIS — S50862A Insect bite (nonvenomous) of left forearm, initial encounter: Secondary | ICD-10-CM | POA: Diagnosis not present

## 2016-12-04 MED ORDER — FLUOCINONIDE-E 0.05 % EX CREA: 1.0000 "application " | TOPICAL_CREAM | Freq: Two times a day (BID) | CUTANEOUS | 0 refills | Status: DC

## 2016-12-04 NOTE — Assessment & Plan Note (Signed)
New- without signs of infection currently. eRx sent for lidex- advised to use twice daily for no longer than a week without follow up. Call or return to clinic prn if these symptoms worsen or fail to improve as anticipated. The patient indicates understanding of these issues and agrees with the plan.

## 2016-12-04 NOTE — Progress Notes (Signed)
Pre visit review using our clinic review tool, if applicable. No additional management support is needed unless otherwise documented below in the visit note. 

## 2016-12-04 NOTE — Progress Notes (Signed)
   Subjective:   Patient ID: EEVA SCHLOSSER, female    DOB: 1990/11/29, 26 y.o.   MRN: [id number]  COVA KNIERIEM is a pleasant 26 y.o. year old female who presents to clinic today with Insect Bite  on 12/04/2016  HPI:   Two days ago, woke up with a little raised area that looked like a mosquito bite on her left forearm. Very itchy, not painful.  Had an erythematous are around it but that has resolved.  But now the bite is larger and looks more fluid filled/blister like.  Current Outpatient Prescriptions on File Prior to Visit  Medication Sig Dispense Refill  . norethindrone-ethinyl estradiol (JUNEL FE 1/20) 1-20 MG-MCG tablet Take 1 tablet by mouth daily. 3 Package 3   No current facility-administered medications on file prior to visit.     No Known Allergies  No past medical history on file.  No past surgical history on file.  Family History  Problem Relation Age of Onset  . Heart disease Father 66       MI  . Heart disease Maternal Grandfather 17       died of MI    Social History   Social History  . Marital status: Single    Spouse name: N/A  . Number of children: N/A  . Years of education: N/A   Occupational History  . Not on file.   Social History Main Topics  . Smoking status: Never Smoker  . Smokeless tobacco: Never Used  . Alcohol use No  . Drug use: No  . Sexual activity: Not on file   Other Topics Concern  . Not on file   Social History Narrative   Student at [employer], wants to become a dental hygenist.   The PMH, PSH, Social History, Family History, Medications, and allergies have been reviewed in [facility], and have been updated if relevant.   Review of Systems  Eyes: Negative.   Respiratory: Negative.   Gastrointestinal: Negative.   Musculoskeletal: Negative.   Skin: Positive for wound.  Neurological: Negative.   Hematological: Negative.   All other systems reviewed and are negative.      Objective:    BP 110/80   Pulse 91   Temp 97.7 F  (36.5 C)   Ht 5\' 2"  (1.575 m)   Wt 137 lb (62.1 kg)   SpO2 98%   BMI 25.06 kg/m    Physical Exam  Constitutional: She is oriented to person, place, and time. She appears well-developed and well-nourished. No distress.  HENT:  Head: Normocephalic and atraumatic.  Eyes: Conjunctivae are normal.  Cardiovascular: Normal rate.   Pulmonary/Chest: Effort normal.  Musculoskeletal: Normal range of motion.  Neurological: She is alert and oriented to person, place, and time. No cranial nerve deficit.  Skin: She is not diaphoretic.     Psychiatric: She has a normal mood and affect. Her behavior is normal. Thought content normal.  Nursing note and vitals reviewed.         Assessment & Plan:   Insect bite, initial encounter No Follow-up on file.

## 2016-12-04 NOTE — Patient Instructions (Signed)
Great to see you.  Lidex twice daily to area for no more than a week without letting me know.

## 2016-12-05 ENCOUNTER — Ambulatory Visit: Payer: 59 | Admitting: Family Medicine

## 2017-06-04 ENCOUNTER — Other Ambulatory Visit: Payer: Self-pay | Admitting: Family Medicine

## 2017-06-13 LAB — OB RESULTS CONSOLE ANTIBODY SCREEN: Antibody Screen: NEGATIVE

## 2017-06-13 LAB — HIV ANTIBODY (ROUTINE TESTING W REFLEX): HIV: NEGATIVE

## 2017-06-13 LAB — CBC AND DIFFERENTIAL
HCT: 34 — AB (ref 36–46)
Hemoglobin: 12.1 (ref 12.0–16.0)
PLATELETS: 261 (ref 150–399)

## 2017-06-13 LAB — OB RESULTS CONSOLE ABO/RH: RH TYPE: POSITIVE

## 2017-06-13 LAB — OB RESULTS CONSOLE HEPATITIS B SURFACE ANTIGEN: Hepatitis B Surface Ag: NEGATIVE

## 2017-06-13 LAB — OB RESULTS CONSOLE HIV ANTIBODY (ROUTINE TESTING): HIV: NONREACTIVE

## 2017-06-13 LAB — OB RESULTS CONSOLE GC/CHLAMYDIA
Chlamydia: NEGATIVE
Gonorrhea: NEGATIVE

## 2017-06-13 LAB — OB RESULTS CONSOLE RPR: RPR: NONREACTIVE

## 2017-06-13 LAB — OB RESULTS CONSOLE RUBELLA ANTIBODY, IGM: RUBELLA: IMMUNE

## 2017-07-01 NOTE — L&D Delivery Note (Signed)
Patient was C/C/+2 and pushed for approx 3 hours total with epidural which was shut off approx 1.5 hours prior to delivery NSVD female infant, Apgars 9/9, weight pending.   The patient had a left periurethral tear requiring 3-0 vicryl repair and a small 1st degree perineal repaired with 2-0 vicryl. Fundus was firm. EBL was expected amount. Placenta was delivered intact. Vagina was clear.  Delayed cord clamping done for 30-60 seconds while warming baby. Baby was vigorous and doing skin to skin with mother.  Allyn Kenner

## 2017-07-02 ENCOUNTER — Other Ambulatory Visit (HOSPITAL_COMMUNITY): Payer: Self-pay | Admitting: Obstetrics

## 2017-07-02 DIAGNOSIS — O289 Unspecified abnormal findings on antenatal screening of mother: Secondary | ICD-10-CM

## 2017-07-04 ENCOUNTER — Encounter (HOSPITAL_COMMUNITY): Payer: Self-pay | Admitting: *Deleted

## 2017-07-08 ENCOUNTER — Ambulatory Visit (HOSPITAL_COMMUNITY)
Admission: RE | Admit: 2017-07-08 | Discharge: 2017-07-08 | Disposition: A | Discharge status: Home / Self Care | Payer: 59 | Source: Ambulatory Visit | Attending: Obstetrics | Admitting: Obstetrics

## 2017-07-08 ENCOUNTER — Encounter (HOSPITAL_COMMUNITY): Payer: Self-pay

## 2017-07-08 ENCOUNTER — Other Ambulatory Visit (HOSPITAL_COMMUNITY): Payer: Self-pay | Admitting: Obstetrics

## 2017-07-08 ENCOUNTER — Other Ambulatory Visit (HOSPITAL_COMMUNITY): Payer: Self-pay | Admitting: *Deleted

## 2017-07-08 ENCOUNTER — Other Ambulatory Visit: Payer: Self-pay

## 2017-07-08 DIAGNOSIS — Z3A13 13 weeks gestation of pregnancy: Secondary | ICD-10-CM | POA: Diagnosis not present

## 2017-07-08 DIAGNOSIS — Z8679 Personal history of other diseases of the circulatory system: Secondary | ICD-10-CM

## 2017-07-08 DIAGNOSIS — Z363 Encounter for antenatal screening for malformations: Secondary | ICD-10-CM

## 2017-07-08 DIAGNOSIS — O289 Unspecified abnormal findings on antenatal screening of mother: Secondary | ICD-10-CM

## 2017-07-08 DIAGNOSIS — O285 Abnormal chromosomal and genetic finding on antenatal screening of mother: Secondary | ICD-10-CM

## 2017-07-08 DIAGNOSIS — Z85828 Personal history of other malignant neoplasm of skin: Secondary | ICD-10-CM

## 2017-07-08 DIAGNOSIS — Z3682 Encounter for antenatal screening for nuchal translucency: Secondary | ICD-10-CM | POA: Diagnosis not present

## 2017-07-08 DIAGNOSIS — O281 Abnormal biochemical finding on antenatal screening of mother: Secondary | ICD-10-CM

## 2017-07-08 HISTORY — DX: Malignant melanoma of skin, unspecified: C43.9

## 2017-07-08 HISTORY — DX: Asymptomatic varicose veins of unspecified lower extremity: I83.90

## 2017-07-08 NOTE — Progress Notes (Signed)
Genetic Counseling  High-Risk Gestation Note  Appointment Date:  07/08/2017 Referred By: Jerelyn Charles, MD Date of Birth:  December 10, 1990  Pregnancy History: G1P0 Estimated Date of Delivery: 01/08/18 Estimated Gestational Age: 65w5dAttending: JViann Fish MD   I met with Mrs. Hannah Hankoand her husband, Mr. Nic Devivo regarding an abnormal noninvasive prenatal screen result. The patient's mother also attended the genetic counseling appointment today.  In summary:  Discussed results of NIPS ([facility])- low fetal fraction ? Low fetal fraction- associated with 1 in 17 risk for underlying fetal aneuploidy (trisomy 145 trisomy 141 or triploidy) in pregnancies ? Specific analysis for fetal aneuploidy conditions unable to be performed due to low fetal fraction ? Reviewed other possible explanations for low fetal fraction   Discussed options for screening ? First trimester screen- patient elected to have this screening today  ? Repeat NIPS through different laboratory using a different methodology- reviewed chance for similar no results due to low fetal fraction  Ultrasound- performed today; NT measurement wnl and nasal bone was visualized; possible CPC seen-discussed too early to diagnose and repeat evaluation is necessary   Discussed diagnostic testing options ? Amniocentesis- declined  Hannah Walsh previously had noninvasive prenatal screening (NIPS)/prenatal cell free DNA testing performed through her OB provider. This screening, [facility], identified a low fetal fraction (3.5%), which is associated with an approximate 1 in 17 risk for underlying fetal aneuploidy (trisomy 129 trisomy 185 or triploidy). We reviewed the methodology of NIPS and discussed differential diagnoses for low fetal fraction including obesity, early gestational age, physiological differences in maternal blood, physiological differences in the placenta, and underlying fetal  aneuploidy. Additionally, we discussed that there may be additional factors that impact fetal fraction in pregnancy that are not yet reported in the medical literature. When fetal fraction is on average less than 2.8-4%, [facility] has in recent years not been able to perform risk assessment for fetal aneuploidy conditions.  Recent outcome study data [facility]McKanna et al 2018), when not expected to be low based on maternal weight or gestational age, identified an increased risk for underlying fetal triploidy, trisomy 135 or trisomy 135 with a positive predictive value of approximately 1 in 171   A nuchal translucency ultrasound was performed today. The NT measurement was normal (0.9 mm) for the gestational age. In addition, the fetal nasal bone was visualized. Although technically too early to assess, there were early signs of choroid plexus cysts. We discussed that the anatomy ultrasound is the ideal time to evaluate the fetus for markers and that the significance of this finding at 13 weeks is not well understood. We discussed the option of returning for a detailed anatomy ultrasound at [redacted] weeks gestation. This appointment was scheduled today. We reviewed the benefits and limitations of ultrasound as a screening tool for fetal aneuploidy. We specifically reviewed its utility in screening for markers of fetal triploidy, trisomy 166 and trisomy 135    We reviewed additional available screening options including First trimester screening and detailed ultrasound. We also reviewed the option of repeat NIPS through a different laboratory, though discussed the possibility of not obtaining a result due to a low fetal fraction. They were counseled that screening tests are used to modify a patient's a priori risk for aneuploidy, typically based on age. This estimate provides a pregnancy specific risk assessment. We reviewed the benefits and limitations of each  option. Specifically, we discussed the  conditions for which each test screens, the detection rates, and false positive rates of each. She was also counseled regarding diagnostic testing via amniocentesis. We reviewed the approximate 1 in 021-117 risk for complications from amniocentesis, including spontaneous pregnancy loss. We discussed the possible results that the tests might provide including: positive, negative, unanticipated, and no result. Finally, they were counseled regarding the cost of each option and potential out of pocket expenses.   After consideration of all the options, Hannah Walsh elected to have First trimester screening. Given the time sensitive nature of this screening, she elected to pursue this testing prior to repeat NIPS. Should this screen show an increased risk for fetal aneuploidy, she may wish to pursue repeat NIPS. She declined the option of amniocentesis, stating that they would not elect to pursue this testing in pregnancy given the associated risk for complications. She understands that screening tests cannot rule out all birth defects or genetic syndromes. The patient was advised of this limitation and states she still does not want additional testing at this time.   Hannah Walsh was provided with written information regarding cystic fibrosis (CF), spinal muscular atrophy (SMA) and hemoglobinopathies including the carrier frequency, availability of carrier screening and prenatal diagnosis if indicated.  In addition, we discussed that CF and hemoglobinopathies are routinely screened for as part of the [facility] newborn screening panel.  After further discussion, she declined screening for CF, SMA and hemoglobinopathies.   Both family histories were reviewed and found to be noncontributory for the patient having a distant maternal relative (first cousin to her grandmother) who had Down syndrome. By report, this relative's mother delivered him at age 50.  We discussed that 95% of  cases of Down syndrome are not inherited and are the result of non-disjunction.  Three to 4% of cases of Down syndrome are the result of a translocation involving chromosome #21.  We discussed the option of chromosome analysis to determine if an individual is a carrier of a balanced translocation involving chromosome #21.  If an individual carries a balanced translocation involving chromosome #21, then the chance to have a baby with Down syndrome would be greater than the maternal age-related risk. Without further information regarding the provided family history, an accurate genetic risk cannot be calculated. Further genetic counseling is warranted if more information is obtained.  Mrs. Savard denied exposure to environmental toxins or chemical agents. She denied the use of alcohol, tobacco or street drugs. She denied significant viral illnesses during the course of her pregnancy. Her medical and surgical histories were noncontributory.   I counseled this couple for approximately 39 minutes regarding the above risks and available options.   Filbert Schilder, MS  Certified Genetic Counselor

## 2017-07-08 NOTE — Addendum Note (Signed)
Encounter addended by: Novella Rob, [place] on: 07/08/2017 1:28 PM  Actions taken: Imaging Exam ended

## 2017-07-11 ENCOUNTER — Other Ambulatory Visit: Payer: Self-pay

## 2017-07-15 ENCOUNTER — Telehealth (HOSPITAL_COMMUNITY): Payer: Self-pay

## 2017-07-15 NOTE — Telephone Encounter (Signed)
Called Hannah Walsh to discuss her First trimester screening results. Mrs. Hannah Walsh had screening through [facility]. Testing was offered because of a low fetal fraction (1 in 17 risk for t13/18 and triploidy) through [place].  The patient was identified by name and DOB.  We reviewed that these are within normal limits, showing a less than 1 in 10,000 risk for trisomies 21, 18 and 13.  We reviewed the detection and false negative rates.  She understands that this testing does not identify all genetic conditions.  She is scheduled to return for a detailed anatomy ultrasound on 08/15/17. All questions were answered to her satisfaction, she was encouraged to call with additional questions or concerns.  Filbert Schilder, MS Certified Genetic Counselor

## 2017-08-12 ENCOUNTER — Other Ambulatory Visit: Payer: Self-pay | Admitting: Family Medicine

## 2017-08-15 ENCOUNTER — Encounter (HOSPITAL_COMMUNITY): Payer: Self-pay

## 2017-08-15 ENCOUNTER — Ambulatory Visit (HOSPITAL_COMMUNITY)
Admission: RE | Admit: 2017-08-15 | Discharge: 2017-08-15 | Disposition: A | Discharge status: Home / Self Care | Payer: 59 | Source: Ambulatory Visit | Attending: Obstetrics | Admitting: Obstetrics

## 2017-08-15 ENCOUNTER — Other Ambulatory Visit (HOSPITAL_COMMUNITY): Payer: Self-pay | Admitting: Obstetrics and Gynecology

## 2017-08-15 DIAGNOSIS — Z3A19 19 weeks gestation of pregnancy: Secondary | ICD-10-CM | POA: Diagnosis not present

## 2017-08-15 DIAGNOSIS — O289 Unspecified abnormal findings on antenatal screening of mother: Secondary | ICD-10-CM

## 2017-08-15 DIAGNOSIS — Z363 Encounter for antenatal screening for malformations: Secondary | ICD-10-CM

## 2017-10-16 ENCOUNTER — Encounter: Payer: Self-pay | Admitting: Family Medicine

## 2017-10-16 NOTE — Progress Notes (Signed)
Hannah Walsh OB/GYN Blood type: A+ Immunity verified: Rubella Tdap given on 4.12.19 right deltoid/thx dmf

## 2017-10-17 ENCOUNTER — Telehealth: Payer: Self-pay | Admitting: Internal Medicine

## 2017-10-17 NOTE — Telephone Encounter (Signed)
Copied from [place] [phone]. Topic: Appointment Scheduling - Scheduling Inquiry for Clinic >> Oct 17, 2017 11:22 AM Margot Ables wrote: Reason for CRM: Former pt with Dr. Deborra Medina. She has not re-established with another provider yet but she will. She is pregnant, due in July, asking if Dr. Silvio Pate can accept her new baby as a pt and schedule appt once born. Please advise.

## 2017-10-21 NOTE — Telephone Encounter (Signed)
I left a message on patient's voice mail to call me back about newborn appointment.

## 2017-10-21 NOTE — Telephone Encounter (Signed)
I spoke to [place].  She said baby will have [facility] insurance.  I let her know she just needs to call when she has the baby and we can schedule the appointment with Dr.Letvak.

## 2017-12-11 LAB — OB RESULTS CONSOLE GBS: GBS: NEGATIVE

## 2017-12-26 ENCOUNTER — Other Ambulatory Visit: Payer: Self-pay

## 2017-12-26 ENCOUNTER — Encounter (HOSPITAL_COMMUNITY): Payer: Self-pay | Admitting: *Deleted

## 2017-12-26 ENCOUNTER — Inpatient Hospital Stay (HOSPITAL_COMMUNITY)
Admission: AD | Admit: 2017-12-26 | Discharge: 2017-12-26 | Disposition: A | Discharge status: Home / Self Care | Payer: 59 | Source: Ambulatory Visit | Attending: Obstetrics and Gynecology | Admitting: Obstetrics and Gynecology

## 2017-12-26 DIAGNOSIS — Z3A38 38 weeks gestation of pregnancy: Secondary | ICD-10-CM | POA: Diagnosis not present

## 2017-12-26 DIAGNOSIS — O26893 Other specified pregnancy related conditions, third trimester: Secondary | ICD-10-CM | POA: Insufficient documentation

## 2017-12-26 DIAGNOSIS — N898 Other specified noninflammatory disorders of vagina: Secondary | ICD-10-CM | POA: Diagnosis not present

## 2017-12-26 LAB — POCT FERN TEST: POCT FERN TEST: NEGATIVE

## 2017-12-26 NOTE — MAU Note (Signed)
About 1330, little bit of fluid, when got out of car, had a big gush.  Little bit still coming. Clear fluid, little bit of white. No bleeding. No pain, feeling some tightness.  Was 1 cm this morning.

## 2017-12-26 NOTE — Discharge Instructions (Signed)

## 2017-12-26 NOTE — MAU Provider Note (Signed)
S: Ms. Hannah Walsh is a 27 y.o. G1P0 at [redacted]w[redacted]d  who presents to MAU today complaining of leaking of fluid since 1400. She denies vaginal bleeding. She denies contractions. She reports normal fetal movement.    O: BP 114/80 (BP Location: Right Arm)   Pulse (!) 102   Temp 98.7 F (37.1 C) (Oral)   Resp 18   Wt 173 lb 4 oz (78.6 kg)   LMP 03/09/2017   SpO2 99%   BMI 32.74 kg/m  GENERAL: Well-developed, well-nourished female in no acute distress.  HEAD: Normocephalic, atraumatic.  CHEST: Normal effort of breathing, regular heart rate ABDOMEN: Soft, nontender, gravid PELVIC: Normal external female genitalia. Vagina is pink and rugated. Cervix with normal contour, no lesions. Normal discharge.  Negative pooling.   Cervical exam:   Deferred, just performed in the office this morning and patient not palpating contractions currently    Fetal Monitoring: Baseline: 150 bpm Variability: moderate Accelerations: 15 x 15 Decelerations: none Contractions: irregular, few  Results for orders placed or performed during the hospital encounter of 12/26/17 (from the past 24 hour(s))  POCT fern test     Status: Normal   Collection Time: 12/26/17  4:30 PM  Result Value Ref Range   POCT Fern Test Negative = intact amniotic membranes     A: SIUP at [redacted]w[redacted]d  Membranes intact  P: Report given to RN to contact MD on call for further instructions  Danielle Rankin 12/26/2017 5:19 PM

## 2018-01-05 ENCOUNTER — Encounter (HOSPITAL_COMMUNITY): Payer: Self-pay | Admitting: *Deleted

## 2018-01-05 ENCOUNTER — Telehealth (HOSPITAL_COMMUNITY): Payer: Self-pay | Admitting: *Deleted

## 2018-01-05 ENCOUNTER — Other Ambulatory Visit: Payer: Self-pay | Admitting: Obstetrics and Gynecology

## 2018-01-05 NOTE — Telephone Encounter (Signed)
Preadmission screen  

## 2018-01-06 ENCOUNTER — Encounter (HOSPITAL_COMMUNITY): Payer: Self-pay | Admitting: *Deleted

## 2018-01-07 ENCOUNTER — Inpatient Hospital Stay (HOSPITAL_COMMUNITY)
Admission: AD | Admit: 2018-01-07 | Discharge: 2018-01-09 | DRG: 807 | Disposition: A | Discharge status: Home / Self Care | Payer: 59 | Source: Ambulatory Visit | Attending: Obstetrics and Gynecology | Admitting: Obstetrics and Gynecology

## 2018-01-07 ENCOUNTER — Inpatient Hospital Stay (HOSPITAL_COMMUNITY): Payer: 59 | Admitting: Anesthesiology

## 2018-01-07 ENCOUNTER — Encounter (HOSPITAL_COMMUNITY): Payer: Self-pay

## 2018-01-07 ENCOUNTER — Other Ambulatory Visit: Payer: Self-pay

## 2018-01-07 DIAGNOSIS — Z85820 Personal history of malignant melanoma of skin: Secondary | ICD-10-CM | POA: Diagnosis not present

## 2018-01-07 DIAGNOSIS — Z3A40 40 weeks gestation of pregnancy: Secondary | ICD-10-CM | POA: Diagnosis not present

## 2018-01-07 DIAGNOSIS — Z3493 Encounter for supervision of normal pregnancy, unspecified, third trimester: Secondary | ICD-10-CM | POA: Diagnosis present

## 2018-01-07 LAB — ABO/RH: ABO/RH(D): A POS

## 2018-01-07 LAB — CBC
HCT: 36.9 % (ref 36.0–46.0)
Hemoglobin: 12.9 g/dL (ref 12.0–15.0)
MCH: 31.9 pg (ref 26.0–34.0)
MCHC: 35 g/dL (ref 30.0–36.0)
MCV: 91.3 fL (ref 78.0–100.0)
PLATELETS: 225 10*3/uL (ref 150–400)
RBC: 4.04 MIL/uL (ref 3.87–5.11)
RDW: 13.6 % (ref 11.5–15.5)
WBC: 15.3 10*3/uL — AB (ref 4.0–10.5)

## 2018-01-07 LAB — RPR: RPR: NONREACTIVE

## 2018-01-07 LAB — TYPE AND SCREEN
ABO/RH(D): A POS
ANTIBODY SCREEN: NEGATIVE

## 2018-01-07 MED ORDER — SENNOSIDES-DOCUSATE SODIUM 8.6-50 MG PO TABS
2.0000 | ORAL_TABLET | ORAL | Status: DC
  Administered 2018-01-08 (×2): 2 via ORAL
  Filled 2018-01-07 (×2): qty 2

## 2018-01-07 MED ORDER — SIMETHICONE 80 MG PO CHEW: 80.0000 mg | CHEWABLE_TABLET | ORAL | Status: DC | PRN

## 2018-01-07 MED ORDER — DIPHENHYDRAMINE HCL 25 MG PO CAPS: 25.0000 mg | ORAL_CAPSULE | Freq: Four times a day (QID) | ORAL | Status: DC | PRN

## 2018-01-07 MED ORDER — FENTANYL 2.5 MCG/ML BUPIVACAINE 1/10 % EPIDURAL INFUSION (WH - ANES)
14.0000 mL/h | INTRAMUSCULAR | Status: DC | PRN
  Administered 2018-01-07: 14 mL/h via EPIDURAL

## 2018-01-07 MED ORDER — WITCH HAZEL-GLYCERIN EX PADS: 1.0000 "application " | MEDICATED_PAD | CUTANEOUS | Status: DC | PRN

## 2018-01-07 MED ORDER — LIDOCAINE HCL (PF) 1 % IJ SOLN
INTRAMUSCULAR | Status: DC | PRN
  Administered 2018-01-07: 5 mL via EPIDURAL

## 2018-01-07 MED ORDER — LIDOCAINE HCL (PF) 1 % IJ SOLN
30.0000 mL | INTRAMUSCULAR | Status: DC | PRN
  Administered 2018-01-07: 30 mL via SUBCUTANEOUS
  Filled 2018-01-07: qty 30

## 2018-01-07 MED ORDER — LACTATED RINGERS IV SOLN
INTRAVENOUS | Status: DC
  Administered 2018-01-07 (×2): via INTRAVENOUS

## 2018-01-07 MED ORDER — EPHEDRINE 5 MG/ML INJ
10.0000 mg | INTRAVENOUS | Status: DC | PRN
  Filled 2018-01-07: qty 2

## 2018-01-07 MED ORDER — OXYTOCIN BOLUS FROM INFUSION: 500.0000 mL | Freq: Once | INTRAVENOUS | Status: DC

## 2018-01-07 MED ORDER — ACETAMINOPHEN 325 MG PO TABS: 650.0000 mg | ORAL_TABLET | ORAL | Status: DC | PRN

## 2018-01-07 MED ORDER — LIDOCAINE HCL (PF) 1 % IJ SOLN
INTRAMUSCULAR | Status: AC
  Administered 2018-01-07: 30 mL via SUBCUTANEOUS
  Filled 2018-01-07: qty 30

## 2018-01-07 MED ORDER — LACTATED RINGERS IV SOLN: 500.0000 mL | Freq: Once | INTRAVENOUS | Status: DC

## 2018-01-07 MED ORDER — OXYCODONE-ACETAMINOPHEN 5-325 MG PO TABS
1.0000 | ORAL_TABLET | ORAL | Status: DC | PRN
  Administered 2018-01-07: 1 via ORAL
  Filled 2018-01-07: qty 1

## 2018-01-07 MED ORDER — ACETAMINOPHEN 325 MG PO TABS
650.0000 mg | ORAL_TABLET | ORAL | Status: DC | PRN
  Administered 2018-01-07 – 2018-01-09 (×4): 650 mg via ORAL
  Filled 2018-01-07 (×4): qty 2

## 2018-01-07 MED ORDER — SOD CITRATE-CITRIC ACID 500-334 MG/5ML PO SOLN: 30.0000 mL | ORAL | Status: DC | PRN

## 2018-01-07 MED ORDER — TETANUS-DIPHTH-ACELL PERTUSSIS 5-2.5-18.5 LF-MCG/0.5 IM SUSP: 0.5000 mL | Freq: Once | INTRAMUSCULAR | Status: DC

## 2018-01-07 MED ORDER — DIPHENHYDRAMINE HCL 50 MG/ML IJ SOLN: 12.5000 mg | INTRAMUSCULAR | Status: DC | PRN

## 2018-01-07 MED ORDER — FENTANYL 2.5 MCG/ML BUPIVACAINE 1/10 % EPIDURAL INFUSION (WH - ANES)
INTRAMUSCULAR | Status: AC
  Filled 2018-01-07: qty 100

## 2018-01-07 MED ORDER — LACTATED RINGERS IV SOLN
500.0000 mL | Freq: Once | INTRAVENOUS | Status: AC
  Administered 2018-01-07: 500 mL via INTRAVENOUS

## 2018-01-07 MED ORDER — OXYTOCIN BOLUS FROM INFUSION
500.0000 mL | Freq: Once | INTRAVENOUS | Status: AC
  Administered 2018-01-07: 500 mL/h via INTRAVENOUS

## 2018-01-07 MED ORDER — TERBUTALINE SULFATE 1 MG/ML IJ SOLN
0.2500 mg | Freq: Once | INTRAMUSCULAR | Status: DC | PRN
  Filled 2018-01-07: qty 1

## 2018-01-07 MED ORDER — ONDANSETRON HCL 4 MG/2ML IJ SOLN: 4.0000 mg | Freq: Four times a day (QID) | INTRAMUSCULAR | Status: DC | PRN

## 2018-01-07 MED ORDER — OXYTOCIN 40 UNITS IN LACTATED RINGERS INFUSION - SIMPLE MED
INTRAVENOUS | Status: AC
  Administered 2018-01-07: 500 mL/h via INTRAVENOUS
  Filled 2018-01-07: qty 1000

## 2018-01-07 MED ORDER — PHENYLEPHRINE 40 MCG/ML (10ML) SYRINGE FOR IV PUSH (FOR BLOOD PRESSURE SUPPORT)
80.0000 ug | PREFILLED_SYRINGE | INTRAVENOUS | Status: DC | PRN
  Filled 2018-01-07: qty 5

## 2018-01-07 MED ORDER — ZOLPIDEM TARTRATE 5 MG PO TABS: 5.0000 mg | ORAL_TABLET | Freq: Every evening | ORAL | Status: DC | PRN

## 2018-01-07 MED ORDER — OXYTOCIN 40 UNITS IN LACTATED RINGERS INFUSION - SIMPLE MED: 2.5000 [IU]/h | INTRAVENOUS | Status: DC

## 2018-01-07 MED ORDER — COCONUT OIL OIL: 1.0000 "application " | TOPICAL_OIL | Status: DC | PRN

## 2018-01-07 MED ORDER — OXYCODONE-ACETAMINOPHEN 5-325 MG PO TABS: 1.0000 | ORAL_TABLET | ORAL | Status: DC | PRN

## 2018-01-07 MED ORDER — PHENYLEPHRINE 40 MCG/ML (10ML) SYRINGE FOR IV PUSH (FOR BLOOD PRESSURE SUPPORT)
PREFILLED_SYRINGE | INTRAVENOUS | Status: AC
  Filled 2018-01-07: qty 20

## 2018-01-07 MED ORDER — BENZOCAINE-MENTHOL 20-0.5 % EX AERO
1.0000 "application " | INHALATION_SPRAY | CUTANEOUS | Status: DC | PRN
  Administered 2018-01-07: 1 via TOPICAL
  Filled 2018-01-07: qty 56

## 2018-01-07 MED ORDER — LACTATED RINGERS IV SOLN: 500.0000 mL | INTRAVENOUS | Status: DC | PRN

## 2018-01-07 MED ORDER — OXYCODONE-ACETAMINOPHEN 5-325 MG PO TABS: 2.0000 | ORAL_TABLET | ORAL | Status: DC | PRN

## 2018-01-07 MED ORDER — ONDANSETRON HCL 4 MG PO TABS: 4.0000 mg | ORAL_TABLET | ORAL | Status: DC | PRN

## 2018-01-07 MED ORDER — ONDANSETRON HCL 4 MG/2ML IJ SOLN: 4.0000 mg | INTRAMUSCULAR | Status: DC | PRN

## 2018-01-07 MED ORDER — OXYTOCIN 40 UNITS IN LACTATED RINGERS INFUSION - SIMPLE MED: 1.0000 m[IU]/min | INTRAVENOUS | Status: DC

## 2018-01-07 MED ORDER — IBUPROFEN 600 MG PO TABS
600.0000 mg | ORAL_TABLET | Freq: Four times a day (QID) | ORAL | Status: DC
  Administered 2018-01-07 – 2018-01-09 (×9): 600 mg via ORAL
  Filled 2018-01-07 (×9): qty 1

## 2018-01-07 MED ORDER — LIDOCAINE HCL (PF) 1 % IJ SOLN
30.0000 mL | INTRAMUSCULAR | Status: DC | PRN
  Filled 2018-01-07: qty 30

## 2018-01-07 MED ORDER — FLEET ENEMA 7-19 GM/118ML RE ENEM: 1.0000 | ENEMA | Freq: Every day | RECTAL | Status: DC | PRN

## 2018-01-07 MED ORDER — PRENATAL MULTIVITAMIN CH
1.0000 | ORAL_TABLET | Freq: Every day | ORAL | Status: DC
  Administered 2018-01-08 – 2018-01-09 (×2): 1 via ORAL
  Filled 2018-01-07 (×2): qty 1

## 2018-01-07 MED ORDER — LACTATED RINGERS IV SOLN: INTRAVENOUS | Status: DC

## 2018-01-07 MED ORDER — DIBUCAINE 1 % RE OINT
1.0000 "application " | TOPICAL_OINTMENT | RECTAL | Status: DC | PRN
  Administered 2018-01-08: 1 via RECTAL
  Filled 2018-01-07: qty 28

## 2018-01-07 MED ORDER — OXYCODONE-ACETAMINOPHEN 5-325 MG PO TABS
2.0000 | ORAL_TABLET | ORAL | Status: DC | PRN
  Administered 2018-01-08: 2 via ORAL
  Filled 2018-01-07: qty 2

## 2018-01-07 NOTE — H&P (Signed)
Pt is an 27 y.o. G1P0 [redacted]w[redacted]d white female who presented to the ER C/O ctxs. She was 7 cm on admission. PNC was complicated by abnl genetic testing. She initially had an abnl NIPT. She was seen by MFM and had an abnl FTS. She declined invasive testing . The MFM u/s was wnl. She had a recent u/s which indicated that the EFW was in the 73%.  Chief Complaint: HPI:  Past Medical History:  Diagnosis Date  . Melanoma ([place])   . PONV (postoperative nausea and vomiting)   . Varicose vein of leg     Past Surgical History:  Procedure Laterality Date  . excision of melanoma    . NO PAST SURGERIES      Family History  Problem Relation Age of Onset  . Hyperlipidemia Mother   . Heart disease Father 5       MI  . Hyperlipidemia Father   . Heart disease Maternal Grandfather 24       died of MI  . Hyperlipidemia Maternal Grandfather   . Breast cancer Maternal Grandmother   . Hyperlipidemia Maternal Grandmother   . Diabetes Maternal Grandmother   . Hyperlipidemia Paternal Grandmother   . Heart disease Paternal Grandfather   . Hyperlipidemia Paternal Grandfather    Social History:  reports that she has never smoked. She has never used smokeless tobacco. She reports that she does not drink alcohol or use drugs.  Allergies: No Known Allergies  Medications Prior to Admission  Medication Sig Dispense Refill  . Prenatal Vit-Fe Fumarate-FA (PRENATAL VITAMIN PO) Take by mouth.         Blood pressure (!) 111/57, pulse 88, temperature 98.7 F (37.1 C), temperature source Oral, resp. rate 16, height 5\' 1"  (1.549 m), weight 174 lb (78.9 kg), last menstrual period 03/09/2017, SpO2 100 %. General appearance: alert, cooperative and mild distress Abdomen: gravid, palp ctxs   Lab Results  Component Value Date   WBC 15.3 (H) 01/07/2018   HGB 12.9 01/07/2018   HCT 36.9 01/07/2018   MCV 91.3 01/07/2018   PLT 225 01/07/2018   No results found for: PREGTESTUR, PREGSERUM, HCG, HCGQUANT  Patient  Active Problem List   Diagnosis Date Noted  . Normal labor 01/07/2018  . [redacted] weeks gestation of pregnancy 01/07/2018  . [redacted] weeks gestation of pregnancy   . Abnormal chromosomal and genetic finding on antenatal screening of mother   . Insect bite 12/04/2016   IMP/ IUP at term in labor         Abnl genetic testing Plan/ Admit  Hannah Walsh 01/07/2018, 5:43 AM

## 2018-01-07 NOTE — Anesthesia Preprocedure Evaluation (Signed)
Anesthesia Evaluation  Patient identified by MRN, date of birth, ID band Patient awake    Reviewed: Allergy & Precautions, NPO status , Patient's Chart, lab work & pertinent test results  History of Anesthesia Complications (+) PONV  Airway Mallampati: II  TM Distance: >3 FB Neck ROM: Full    Dental no notable dental hx. (+) Teeth Intact   Pulmonary neg pulmonary ROS,    Pulmonary exam normal breath sounds clear to auscultation       Cardiovascular Normal cardiovascular exam Rhythm:Regular Rate:Normal     Neuro/Psych negative neurological ROS  negative psych ROS   GI/Hepatic   Endo/Other    Renal/GU      Musculoskeletal   Abdominal   Peds  Hematology   Anesthesia Other Findings   Reproductive/Obstetrics (+) Pregnancy                            Lab Results  Component Value Date   WBC 15.3 (H) 01/07/2018   HGB 12.9 01/07/2018   HCT 36.9 01/07/2018   MCV 91.3 01/07/2018   PLT 225 01/07/2018    Anesthesia Physical Anesthesia Plan  ASA: II  Anesthesia Plan: Epidural   Post-op Pain Management:    Induction:   PONV Risk Score and Plan:   Airway Management Planned:   Additional Equipment:   Intra-op Plan:   Post-operative Plan:   Informed Consent: I have reviewed the patients History and Physical, chart, labs and discussed the procedure including the risks, benefits and alternatives for the proposed anesthesia with the patient or authorized representative who has indicated his/her understanding and acceptance.     Plan Discussed with:   Anesthesia Plan Comments:         Anesthesia Quick Evaluation

## 2018-01-07 NOTE — Progress Notes (Signed)
Patient reports severe neck and upper back pain that started 101min after epidural was started.  Overnight nurse applied warm pack to upper back.  Pt pushed for 50min, then labored down, pushed another approx 1hour and labored down again prior to me coming on call.  I visited patient to discuss as RN alerted me that pain was preventing her from pushing effectively.  I assessed and treated upper back and neck with soft tissue osteopathic manipulative techniques and RN called anesthesia to see if they had recommendations.  They recommended shutting off epidural. I assess station of baby's head and find it to be +2.  Pt reported feeling better after treatment and shutting off epidural.  Advised RN to wait 10 min to allow pt to rest and breathe and to attempt pushing again.  FHT overall reassuring.

## 2018-01-07 NOTE — Anesthesia Procedure Notes (Signed)
Epidural Patient location during procedure: OB Start time: 01/07/2018 1:19 AM End time: 01/07/2018 1:33 AM  Staffing Anesthesiologist: Barnet Glasgow, MD Performed: anesthesiologist   Preanesthetic Checklist Completed: patient identified, site marked, surgical consent, pre-op evaluation, timeout performed, IV checked, risks and benefits discussed and monitors and equipment checked  Epidural Patient position: sitting Prep: site prepped and draped and DuraPrep Patient monitoring: continuous pulse ox and blood pressure Approach: midline Location: L3-L4 Injection technique: LOR air  Needle:  Needle type: Tuohy  Needle gauge: 17 G Needle length: 9 cm and 9 Needle insertion depth: 7 cm Catheter type: closed end flexible Catheter size: 19 Gauge Catheter at skin depth: 12 cm Test dose: negative  Assessment Events: blood not aspirated, injection not painful, no injection resistance, negative IV test and no paresthesia

## 2018-01-07 NOTE — MAU Note (Signed)
Pt reports contractions every 3-5 mins x2 hours. Denies LOF or vaginal bleeding. Reports good fetal movement. Cervix was 3cm on Wednesday

## 2018-01-07 NOTE — Progress Notes (Signed)
Spoke to Dr. Ola Spurr regarding patient's back and neck spasms. Turn epidural pump off and reassess patient's pain in half an hour.

## 2018-01-07 NOTE — Progress Notes (Signed)
Pt rapidly progressed to an AL, She has then had a protracted labor curve. The ant lip was reduced. Will start pushing.

## 2018-01-07 NOTE — Anesthesia Procedure Notes (Deleted)
Anesthesia Regional Block: Narrative:       

## 2018-01-07 NOTE — Anesthesia Postprocedure Evaluation (Signed)
Anesthesia Post Note  Patient: Hannah Walsh  Procedure(s) Performed: AN AD HOC LABOR EPIDURAL     Patient location during evaluation: Mother Baby Anesthesia Type: Epidural Level of consciousness: awake Pain management: pain level controlled Vital Signs Assessment: post-procedure vital signs reviewed and stable Respiratory status: spontaneous breathing Cardiovascular status: stable Postop Assessment: epidural receding, patient able to bend at knees and no backache Anesthetic complications: no    Last Vitals:  Vitals:   01/07/18 1200 01/07/18 1215  BP: 133/79 115/70  Pulse: 92 91  Resp:    Temp:    SpO2:      Last Pain:  Vitals:   01/07/18 1305  TempSrc:   PainSc: 7    Pain Goal:                 Everette Rank

## 2018-01-08 ENCOUNTER — Inpatient Hospital Stay (HOSPITAL_COMMUNITY): Admission: RE | Admit: 2018-01-08 | Payer: 59 | Source: Ambulatory Visit

## 2018-01-08 LAB — CBC
HCT: 28.9 % — ABNORMAL LOW (ref 36.0–46.0)
Hemoglobin: 10 g/dL — ABNORMAL LOW (ref 12.0–15.0)
MCH: 32.6 pg (ref 26.0–34.0)
MCHC: 34.6 g/dL (ref 30.0–36.0)
MCV: 94.1 fL (ref 78.0–100.0)
PLATELETS: 188 10*3/uL (ref 150–400)
RBC: 3.07 MIL/uL — AB (ref 3.87–5.11)
RDW: 14.1 % (ref 11.5–15.5)
WBC: 16.7 10*3/uL — AB (ref 4.0–10.5)

## 2018-01-08 MED ORDER — CYCLOBENZAPRINE HCL 10 MG PO TABS
10.0000 mg | ORAL_TABLET | Freq: Three times a day (TID) | ORAL | Status: DC | PRN
  Administered 2018-01-08 – 2018-01-09 (×3): 10 mg via ORAL
  Filled 2018-01-08 (×5): qty 1

## 2018-01-08 NOTE — Discharge Summary (Signed)
Obstetric Discharge Summary Reason for Admission: onset of labor Prenatal Procedures: none Intrapartum Procedures: spontaneous vaginal delivery Postpartum Procedures: none Complications-Operative and Postpartum: 1 degree perineal laceration Hemoglobin  Date Value Ref Range Status  01/08/2018 10.0 (L) 12.0 - 15.0 g/dL Final    Comment:    DELTA CHECK NOTED REPEATED TO VERIFY    HCT  Date Value Ref Range Status  01/08/2018 28.9 (L) 36.0 - 46.0 % Final    Discharge Diagnoses: Term Pregnancy-delivered  Discharge Information: Date: 01/08/2018 Activity: pelvic rest Diet: routine Medications: Ibuprofen Condition: stable Instructions: refer to practice specific booklet Discharge to: home Follow-up Information    Allyn Kenner, DO Follow up in 4 week(s).   Specialty:  Obstetrics and Gynecology Contact information: [address] [place] [address] [phone]           Newborn Data: Live born female  Birth Weight: 8 lb 3 oz (3714 g) APGAR: 60, 9  Newborn Delivery   Birth date/time:  01/07/2018 10:22:00 Delivery type:  Vaginal, Spontaneous     Home with mother.  Kaden Dunkel A 01/08/2018, 7:50 AM

## 2018-01-08 NOTE — Progress Notes (Addendum)
Patient is eating, ambulating, voiding.  Pain control is good.  Vitals:   01/07/18 2102 01/07/18 2158 01/08/18 0112 01/08/18 0529  BP: 113/71  115/83 112/87  Pulse: 86  76 78  Resp: 18  18 18   Temp: (!) 100.5 F (38.1 C) 99.3 F (37.4 C) 98.2 F (36.8 C) 97.8 F (36.6 C)  TempSrc: Oral Oral Oral Oral  SpO2: 99%  99% 98%  Weight:      Height:        Fundus firm Perineum without swelling.  Lab Results  Component Value Date   WBC 16.7 (H) 01/08/2018   HGB 10.0 (L) 01/08/2018   HCT 28.9 (L) 01/08/2018   MCV 94.1 01/08/2018   PLT 188 01/08/2018    --/--/A POS, A POS Performed at [facility], [address], [place], [place] [address]  (07/10 0039)/[place]  A/P Post partum day 1.  Routine care.  Expect d/c routine.    Salome Cozby A

## 2018-01-08 NOTE — Lactation Note (Signed)
This note was copied from a baby's chart. Lactation Consultation Note  Patient Name: Hannah Walsh [id number] Date: 01/08/2018 Reason for consult: Follow-up assessment  Visited with P1 Mom of 8 hr old term baby. Mom has been having difficulty staying latched onto breast.  Mom has red hair and very fair skin.  Both nipples flat, right nipple slightly erect.  Both nipples very pink.    Baby latched on aggressively and slips directly onto nipple.  Mom unsure of what to do. Hand pump used to help evert nipple.  Demonstrated breast massage and hand expression, drop of colostrum expressed. Initiated a 24 mm nipple shield with instructions on use and cleaning.  Nipple pulled well into shield.  Positioned baby in football hold on left side.  Baby needed some time to get coordinated, but soon became very rhythmic.  Mom taught to use alternate breast compression to increase milk transfer.  When baby taken off NS, colostrum noted in shield.  Assisted with baby latching on left breast using 24 mm nipple shield following pre-pumping.  Baby became nutritive very quickly.  Mom instructed to post breastfeed double pump, DEBP set up at bedside.Talked about using curved tip syringe with expressed colostrum to instill into shield prior to next feeding.   Mom also given shells to wear. Encouraged STS, and feeding baby often on cue.  To ask for help prn. Lactation brochure given to Mom.  Mom aware of IP and OP lactation services available.  Maternal Data Formula Feeding for Exclusion: No Has patient been taught Hand Expression?: Yes Does the patient have breastfeeding experience prior to this delivery?: No  Feeding Feeding Type: Breast Fed Length of feed: 15 min  LATCH Score Latch: Grasps breast easily, tongue down, lips flanged, rhythmical sucking.  Audible Swallowing: A few with stimulation  Type of Nipple: Flat  Comfort (Breast/Nipple): Soft / non-tender  Hold (Positioning): Assistance needed to  correctly position infant at breast and maintain latch.  LATCH Score: 7  Interventions Interventions: Breast feeding basics reviewed;Pre-pump if needed;Hand express;Breast massage;Skin to skin;Assisted with latch;Breast compression;Adjust position;Support pillows;Position options;Expressed milk;Shells;Hand pump;DEBP  Lactation Tools Discussed/Used Tools: Nipple Shields Nipple shield size: 24 Shell Type: Inverted Breast pump type: Double-Electric Breast Pump Pump Review: Setup, frequency, and cleaning;Milk Storage Initiated by:: Cipriano Mile RN IBCLC Date initiated:: 01/08/18   Consult Status Consult Status: Follow-up Date: 01/09/18 Follow-up type: In-patient    Broadus John 01/08/2018, 4:16 PM

## 2018-01-09 ENCOUNTER — Encounter: Payer: Self-pay | Admitting: Family Medicine

## 2018-01-09 MED ORDER — IBUPROFEN 600 MG PO TABS: 600.0000 mg | ORAL_TABLET | Freq: Four times a day (QID) | ORAL | 0 refills | Status: DC | PRN

## 2018-01-09 MED ORDER — CYCLOBENZAPRINE HCL 10 MG PO TABS: 10.0000 mg | ORAL_TABLET | Freq: Three times a day (TID) | ORAL | 0 refills | Status: DC | PRN

## 2018-01-09 MED ORDER — OXYCODONE-ACETAMINOPHEN 5-325 MG PO TABS: 1.0000 | ORAL_TABLET | Freq: Four times a day (QID) | ORAL | 0 refills | Status: DC | PRN

## 2018-01-09 NOTE — Lactation Note (Signed)
This note was copied from a baby's chart. Lactation Consultation Note  Patient Name: Hannah Walsh Date: 01/09/2018 Reason for consult: Follow-up assessment Baby is currently on breast using a 24 mm nipple shield.  Baby is actively sucking with good depth achieved.  Swallows observed and milk in shield when baby came off.  Instructed on using good breast massage during feeding.  Mom states baby has been eating every 1-2 hours so unable to pump.  Discussed milk coming to volume and engorgement prevention and treatment.  Lactation outpatient services and support information reviewed and encouraged prn.  Maternal Data    Feeding Feeding Type: Breast Fed Length of feed: 15 min  LATCH Score                   Interventions    Lactation Tools Discussed/Used Tools: Shells Nipple shield size: 24 Shell Type: Inverted Breast pump type: Double-Electric Breast Pump   Consult Status Consult Status: Complete Follow-up type: Call as needed    Ave Filter 01/09/2018, 10:20 AM

## 2018-01-12 ENCOUNTER — Encounter (HOSPITAL_COMMUNITY): Payer: Self-pay | Admitting: *Deleted

## 2018-01-12 ENCOUNTER — Inpatient Hospital Stay (HOSPITAL_COMMUNITY)
Admission: AD | Admit: 2018-01-12 | Discharge: 2018-01-12 | Disposition: A | Discharge status: Home / Self Care | Payer: 59 | Source: Ambulatory Visit | Attending: Obstetrics and Gynecology | Admitting: Obstetrics and Gynecology

## 2018-01-12 ENCOUNTER — Inpatient Hospital Stay (HOSPITAL_COMMUNITY): Payer: 59 | Admitting: Anesthesiology

## 2018-01-12 ENCOUNTER — Other Ambulatory Visit: Payer: Self-pay

## 2018-01-12 DIAGNOSIS — O894 Spinal and epidural anesthesia-induced headache during the puerperium: Secondary | ICD-10-CM | POA: Diagnosis present

## 2018-01-12 DIAGNOSIS — M542 Cervicalgia: Secondary | ICD-10-CM | POA: Insufficient documentation

## 2018-01-12 MED ORDER — LACTATED RINGERS IV BOLUS
500.0000 mL | Freq: Once | INTRAVENOUS | Status: AC
  Administered 2018-01-12: 500 mL via INTRAVENOUS

## 2018-01-12 NOTE — MAU Provider Note (Signed)
History     CSN: [id number]  Arrival date and time: 01/12/18 1253   None     Chief Complaint  Patient presents with  . Headache  . Neck Pain   HPI  Hannah Walsh is 27 y.o. G1P1001 presents with headache, hearing is muffled, can hear throbbing, neck pain that is worse with sitting.  Took Ibuprofen at 10am hasn't not helped. Sxs after NSVD with Epidural on July 10th.  States she was seen by anesthesia prior to discharge.  Denies migraine hx.  Reports pregnancy was uncomplicated They have already spoken with Dr. Rogue Bussing today, instructed to come to [facility].    Past Medical History:  Diagnosis Date  . Melanoma ([place])   . PONV (postoperative nausea and vomiting)   . Varicose vein of leg     Past Surgical History:  Procedure Laterality Date  . excision of melanoma    . NO PAST SURGERIES      Family History  Problem Relation Age of Onset  . Hyperlipidemia Mother   . Heart disease Father 52       MI  . Hyperlipidemia Father   . Heart disease Maternal Grandfather 12       died of MI  . Hyperlipidemia Maternal Grandfather   . Breast cancer Maternal Grandmother   . Hyperlipidemia Maternal Grandmother   . Diabetes Maternal Grandmother   . Hyperlipidemia Paternal Grandmother   . Heart disease Paternal Grandfather   . Hyperlipidemia Paternal Grandfather     Social History   Tobacco Use  . Smoking status: Never Smoker  . Smokeless tobacco: Never Used  Substance Use Topics  . Alcohol use: No  . Drug use: No    Allergies: No Known Allergies  Medications Prior to Admission  Medication Sig Dispense Refill Last Dose  . cyclobenzaprine (FLEXERIL) 10 MG tablet Take 1 tablet (10 mg total) by mouth 3 (three) times daily as needed for muscle spasms. 30 tablet 0   . ibuprofen (ADVIL,MOTRIN) 600 MG tablet Take 1 tablet (600 mg total) by mouth every 6 (six) hours as needed. 90 tablet 0   . oxyCODONE-acetaminophen (PERCOCET/ROXICET) 5-325 MG tablet Take 1-2 tablets by mouth every 6  (six) hours as needed for severe pain. 16 tablet 0   . Prenatal Vit-Fe Fumarate-FA (PRENATAL VITAMIN PO) Take 1 tablet by mouth daily.    01/06/2018 at Unknown time    Review of Systems  Constitutional: Positive for activity change. Negative for fever.  HENT:       + for neck pain + for muffled hearing, able to hear throbbing in her ears  Respiratory: Negative for shortness of breath.   Cardiovascular: Negative for chest pain.  Musculoskeletal: Positive for back pain.   Physical Exam   Blood pressure 118/82, pulse 94, temperature 98.3 F (36.8 C), temperature source Oral, resp. rate 20, height 5\' 1"  (1.549 m), weight 152 lb 12 oz (69.3 kg), SpO2 100 %, unknown if currently breastfeeding.  Physical Exam  Constitutional: She is oriented to person, place, and time. She appears well-developed and well-nourished.  She is lying flat and is comfortable in that position only  Neurological: She is alert and oriented to person, place, and time.  Skin: Skin is warm and dry.  Psychiatric: She has a normal mood and affect. Her behavior is normal. Thought content normal.     MAU Course  Procedures  MDM MSE Reported HPI to Dr. Rogue Bussing.  Order given to contact Anesthesia consult. Called and spoke  to Dr. Ambrose Pancoast. IV  PROCEDURE: Blood Patch Observation X 2 hrs per Dr. Rosezella Rumpf will check on her in 1 hr. 15:10  Went in to check on [place].  She is feeling much better.  Dr. Eligha Bridegroom returned to check on patient and stated she could go home around 4 if she continued to feel better.   Assessment and Plan  A; Neck pain     Decrease hearing     Post Epidural Headache  P:  Patient is feeling much better and is ready for discharge      Post procedure instructions were given by Dr. Kelly Splinter.  She is aware she is to avoid all lifting and straining for 48hrs      She is to return for worsening sxs.    [name] 01/12/2018, 1:29 PM

## 2018-01-12 NOTE — Anesthesia Procedure Notes (Addendum)
Epidural Patient location during procedure: OB Start time: 01/12/2018 2:40 PM End time: 01/12/2018 2:55 PM  Staffing Anesthesiologist: Janeece Riggers, MD  Preanesthetic Checklist Completed: patient identified, site marked, surgical consent, pre-op evaluation, timeout performed, IV checked, risks and benefits discussed and monitors and equipment checked  Epidural Patient position: sitting Prep: site prepped and draped and DuraPrep Patient monitoring: continuous pulse ox and blood pressure Approach: midline Injection technique: LOR air  Needle:  Needle type: Tuohy  Needle gauge: 17 G Needle length: 9 cm and 9 Needle insertion depth: 5 cm cm Catheter type: closed end flexible Catheter size: 19 Gauge Catheter at skin depth: 10 cm Test dose: negative  Assessment Events: blood not aspirated, injection not painful, no injection resistance, negative IV test and no paresthesia  Additional Notes Blood patch 30 cc autologus

## 2018-01-12 NOTE — MAU Note (Signed)
Pt presents with c/o H/A, neck pain, and muffled hearing since Wednesday.  States she spoke to OB/Gyn and was told she needed eval for spinal HA. S/P NVD 01/07/2017

## 2018-01-12 NOTE — Discharge Instructions (Signed)
Spinal Anesthesia and Epidural Anesthesia, Care After These instructions provide you with information about caring for yourself after your procedure. Your health care provider may also give you more specific instructions. Your treatment has been planned according to current medical practices, but problems sometimes occur. Call your health care provider if you have any problems or questions after your procedure. What can I expect after the procedure? After the procedure, it is common to have:  Sleepiness.  Nausea.  Vomiting.  Numbness or tingling in your legs.  Trouble urinating.  Itching.  Follow these instructions at home: For at least 24 hours after the procedure:   Do not: ? Participate in activities in which you could fall or become injured. ? Drive. ? Use heavy machinery. ? Drink alcohol. ? Take sleeping pills or medicines that cause drowsiness. ? Make important decisions or sign legal documents. ? Take care of children on your own.  Rest. Eating and drinking  If you vomit, drink water, juice, or soup when you can drink without vomiting.  Make sure you have little or no nausea before eating solid foods.  Follow the diet that is recommended by your health care provider. General instructions  Have a responsible adult stay with you until you are awake and alert.  Take over-the-counter and prescription medicines only as told by your health care provider.  If you smoke, do not smoke without supervision.  Keep all follow-up visits as told by your health care provider. This is important. Contact a health care provider if:  You have nausea and vomiting that continue the day after anesthetic medicine was given.  You develop a rash. Get help right away if:  You have a fever.  You have a persistent or severe headache.  You develop blurred or double vision.  You develop dizziness or feel light-headed.  You faint.  You have weakness, numbness, or tingling in  your arms or legs.  You have difficulty breathing.  You are unable to pass urine. This information is not intended to replace advice given to you by your health care provider. Make sure you discuss any questions you have with your health care provider. Document Released: 09/07/2003 Document Revised: 11/24/2015 Document Reviewed: 10/09/2015 Elsevier Interactive Patient Education  [employer].

## 2018-02-12 ENCOUNTER — Other Ambulatory Visit: Payer: Self-pay

## 2018-02-12 DIAGNOSIS — C439 Malignant melanoma of skin, unspecified: Secondary | ICD-10-CM

## 2018-02-12 HISTORY — DX: Malignant melanoma of skin, unspecified: C43.9

## 2018-02-12 MED ORDER — NORETHINDRONE 0.35 MG PO TABS
1.0000 | ORAL_TABLET | Freq: Every day | ORAL | 0 refills | Status: DC
Start: 2018-02-12 — End: 2021-04-13

## 2018-02-12 NOTE — Progress Notes (Signed)
Abstracted from [facility]/thx dmf

## 2018-07-31 ENCOUNTER — Ambulatory Visit: Payer: 59 | Admitting: Primary Care

## 2018-07-31 ENCOUNTER — Encounter: Payer: Self-pay | Admitting: Primary Care

## 2018-07-31 VITALS — BP 110/72 | HR 82 | Temp 98.5°F | Ht 61.0 in | Wt 138.8 lb

## 2018-07-31 DIAGNOSIS — Z0000 Encounter for general adult medical examination without abnormal findings: Secondary | ICD-10-CM

## 2018-07-31 LAB — COMPREHENSIVE METABOLIC PANEL
ALK PHOS: 62 U/L (ref 39–117)
ALT: 12 U/L (ref 0–35)
AST: 14 U/L (ref 0–37)
Albumin: 4.6 g/dL (ref 3.5–5.2)
BUN: 10 mg/dL (ref 6–23)
CALCIUM: 9.9 mg/dL (ref 8.4–10.5)
CO2: 27 mEq/L (ref 19–32)
Chloride: 102 mEq/L (ref 96–112)
Creatinine, Ser: 0.68 mg/dL (ref 0.40–1.20)
GFR: 104.09 mL/min (ref >60.00)
GLUCOSE: 93 mg/dL (ref 70–99)
POTASSIUM: 4.2 meq/L (ref 3.5–5.1)
Sodium: 136 mEq/L (ref 135–145)
TOTAL PROTEIN: 7.4 g/dL (ref 6.0–8.3)
Total Bilirubin: 0.5 mg/dL (ref 0.2–1.2)

## 2018-07-31 LAB — LIPID PANEL
CHOL/HDL RATIO: 3
Cholesterol: 145 mg/dL (ref 0–200)
HDL: 46.7 mg/dL (ref >39.00)
LDL Cholesterol: 91 mg/dL (ref 0–99)
NONHDL: 97.95
TRIGLYCERIDES: 33 mg/dL (ref 0.0–149.0)
VLDL: 6.6 mg/dL (ref 0.0–40.0)

## 2018-07-31 NOTE — Patient Instructions (Signed)
Continue exercising. You should be getting 150 minutes of moderate intensity exercise weekly.  Continue to work on a healthy diet as discussed.  Ensure you are consuming 64 ounces of water daily.  Stop by the lab prior to leaving today. I will notify you of your results once received.   We will see you in one year for your physical or sooner if needed.  It was a pleasure meeting you!   Preventive Care 18-39 Years, Female Preventive care refers to lifestyle choices and visits with your health care provider that can promote health and wellness. What does preventive care include?   A yearly physical exam. This is also called an annual well check.  Dental exams once or twice a year.  Routine eye exams. Ask your health care provider how often you should have your eyes checked.  Personal lifestyle choices, including: ? Daily care of your teeth and gums. ? Regular physical activity. ? Eating a healthy diet. ? Avoiding tobacco and drug use. ? Limiting alcohol use. ? Practicing safe sex. ? Taking vitamin and mineral supplements as recommended by your health care provider. What happens during an annual well check? The services and screenings done by your health care provider during your annual well check will depend on your age, overall health, lifestyle risk factors, and family history of disease. Counseling Your health care provider may ask you questions about your:  Alcohol use.  Tobacco use.  Drug use.  Emotional well-being.  Home and relationship well-being.  Sexual activity.  Eating habits.  Work and work Statistician.  Method of birth control.  Menstrual cycle.  Pregnancy history. Screening You may have the following tests or measurements:  Height, weight, and BMI.  Diabetes screening. This is done by checking your blood sugar (glucose) after you have not eaten for a while (fasting).  Blood pressure.  Lipid and cholesterol levels. These may be checked  every 5 years starting at age 21.  Skin check.  Hepatitis C blood test.  Hepatitis B blood test.  Sexually transmitted disease (STD) testing.  BRCA-related cancer screening. This may be done if you have a family history of breast, ovarian, tubal, or peritoneal cancers.  Pelvic exam and Pap test. This may be done every 3 years starting at age 67. Starting at age 52, this may be done every 5 years if you have a Pap test in combination with an HPV test. Discuss your test results, treatment options, and if necessary, the need for more tests with your health care provider. Vaccines Your health care provider may recommend certain vaccines, such as:  Influenza vaccine. This is recommended every year.  Tetanus, diphtheria, and acellular pertussis (Tdap, Td) vaccine. You may need a Td booster every 10 years.  Varicella vaccine. You may need this if you have not been vaccinated.  HPV vaccine. If you are 107 or younger, you may need three doses over 6 months.  Measles, mumps, and rubella (MMR) vaccine. You may need at least one dose of MMR. You may also need a second dose.  Pneumococcal 13-valent conjugate (PCV13) vaccine. You may need this if you have certain conditions and were not previously vaccinated.  Pneumococcal polysaccharide (PPSV23) vaccine. You may need one or two doses if you smoke cigarettes or if you have certain conditions.  Meningococcal vaccine. One dose is recommended if you are age 55-21 years and a first-year college student living in a residence hall, or if you have one of several medical conditions. You may  also need additional booster doses.  Hepatitis A vaccine. You may need this if you have certain conditions or if you travel or work in places where you may be exposed to hepatitis A.  Hepatitis B vaccine. You may need this if you have certain conditions or if you travel or work in places where you may be exposed to hepatitis B.  Haemophilus influenzae type b (Hib)  vaccine. You may need this if you have certain risk factors. Talk to your health care provider about which screenings and vaccines you need and how often you need them. This information is not intended to replace advice given to you by your health care provider. Make sure you discuss any questions you have with your health care provider. Document Released: 08/13/2001 Document Revised: 01/28/2017 Document Reviewed: 04/18/2015 Elsevier Interactive Patient Education  2019 [employer].

## 2018-07-31 NOTE — Progress Notes (Signed)
Subjective:    Patient ID: Hannah Walsh, female    DOB: Dec 20, 1990, 28 y.o.   MRN: [id number]  HPI  Hannah Walsh is a 28 year old female who presents today to transfer care from Hannah Walsh and for complete physical.  1) Birth Control Surveillance: Currently managed on norethindrone 0.35 mg tablet. She is also taking prenatal vitamins. Is post partum since July 2019. She is will following with GYN.    Immunizations: -Tetanus: Completed in 2019 -Influenza: Completed this season    Diet: She endorses a healthy diet Breakfast: Protein shake, smoothie, grilled chicken  Lunch: Left overs Dinner: Grilled chicken, pork, vegetable, starch Snacks: None Desserts: 1-2 times weekly  Beverages: Water, coffee, occasional alcohol  Exercise: She is exercising at the gym 2-3 days weekly Eye exam: no recent exam Dental exam: Completes semi-annually  Pap Smear: Completed per GYN in September 2019, normal   Review of Systems  Constitutional: Negative for unexpected weight change.  HENT: Negative for rhinorrhea.   Respiratory: Negative for cough and shortness of breath.   Cardiovascular: Negative for chest pain.  Gastrointestinal: Negative for constipation and diarrhea.  Genitourinary: Negative for difficulty urinating.       History of irregular menstrual cycles  Musculoskeletal: Negative for arthralgias and myalgias.  Skin: Negative for rash.  Allergic/Immunologic: Negative for environmental allergies.  Neurological: Negative for dizziness, numbness and headaches.  Psychiatric/Behavioral: The patient is not nervous/anxious.        Past Medical History:  Diagnosis Date  . Abnormal chromosomal and genetic finding on antenatal screening of mother   . Hemorrhoids   . Malignant melanoma of skin ([place]) 02/12/2018  . Melanoma ([place])   . PONV (postoperative nausea and vomiting)   . Single liveborn, born in hospital, delivered by vaginal delivery 01/08/2018   Had a daughter 8lb 3oz  . Varicose  vein of leg      Social History   Socioeconomic History  . Marital status: Single    Spouse name: Not on file  . Number of children: Not on file  . Years of education: Not on file  . Highest education level: Not on file  Occupational History  . Not on file  Social Needs  . Financial resource strain: Not on file  . Food insecurity:    Worry: Not on file    Inability: Not on file  . Transportation needs:    Medical: Not on file    Non-medical: Not on file  Tobacco Use  . Smoking status: Never Smoker  . Smokeless tobacco: Never Used  Substance and Sexual Activity  . Alcohol use: No  . Drug use: No  . Sexual activity: Yes    Partners: Male    Birth control/protection: Pill  Lifestyle  . Physical activity:    Days per week: Not on file    Minutes per session: Not on file  . Stress: Not on file  Relationships  . Social connections:    Talks on phone: Not on file    Gets together: Not on file    Attends religious service: Not on file    Active member of club or organization: Not on file    Attends meetings of clubs or organizations: Not on file    Relationship status: Not on file  . Intimate partner violence:    Fear of current or ex partner: Not on file    Emotionally abused: Not on file    Physically abused: Not on file  Forced sexual activity: Not on file  Other Topics Concern  . Not on file  Social History Narrative   Married.   1 child.   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    Past Surgical History:  Procedure Laterality Date  . excision of melanoma      Family History  Problem Relation Age of Onset  . Hyperlipidemia Mother   . Heart disease Father 76       MI  . Hyperlipidemia Father   . Heart disease Maternal Grandfather 24       died of MI  . Hyperlipidemia Maternal Grandfather   . Breast cancer Maternal Grandmother   . Hyperlipidemia Maternal Grandmother   . Diabetes Maternal Grandmother   . Cervical cancer Maternal  Grandmother   . Hyperlipidemia Paternal Grandmother   . Lung cancer Paternal Grandmother   . Heart disease Paternal Grandfather   . Hyperlipidemia Paternal Grandfather     No Known Allergies  Current Outpatient Medications on File Prior to Visit  Medication Sig Dispense Refill  . norethindrone (ORTHO MICRONOR) 0.35 MG tablet Take 1 tablet (0.35 mg total) by mouth daily. 1 Package 0   No current facility-administered medications on file prior to visit.     BP 110/72   Pulse 82   Temp 98.5 F (36.9 C) (Oral)   Ht 5\' 1"  (1.549 m)   Wt 138 lb 12 oz (62.9 kg)   SpO2 99%   Breastfeeding Yes   BMI 26.22 kg/m    Objective:   Physical Exam  Constitutional: She is oriented to person, place, and time. She appears well-nourished.  HENT:  Mouth/Throat: No oropharyngeal exudate.  Eyes: Pupils are equal, round, and reactive to light. EOM are normal.  Neck: Neck supple. No thyromegaly present.  Cardiovascular: Normal rate and regular rhythm.  Respiratory: Effort normal and breath sounds normal.  GI: Soft. Bowel sounds are normal. There is no abdominal tenderness.  Musculoskeletal: Normal range of motion.  Neurological: She is alert and oriented to person, place, and time.  Skin: Skin is warm and dry.  Psychiatric: She has a normal mood and affect.           Assessment & Plan:

## 2018-07-31 NOTE — Assessment & Plan Note (Signed)
Immunizations UTD. Pap smear UTD per patient. Recommended to continue with regular exercise, healthy diet. Exam unremarkable.  Labs pending. Follow up in 1 year for CPE.

## 2018-10-30 ENCOUNTER — Ambulatory Visit (INDEPENDENT_AMBULATORY_CARE_PROVIDER_SITE_OTHER): Payer: 59 | Admitting: Primary Care

## 2018-10-30 DIAGNOSIS — R0989 Other specified symptoms and signs involving the circulatory and respiratory systems: Secondary | ICD-10-CM | POA: Diagnosis not present

## 2018-10-30 DIAGNOSIS — R09A2 Foreign body sensation, throat: Secondary | ICD-10-CM | POA: Insufficient documentation

## 2018-10-30 HISTORY — DX: Foreign body sensation, throat: R09.A2

## 2018-10-30 NOTE — Telephone Encounter (Signed)
Hannah Walsh, will you please schedule her for a virtual visit?

## 2018-10-30 NOTE — Telephone Encounter (Signed)
lvm asking pt to call office °

## 2018-10-30 NOTE — Progress Notes (Signed)
Subjective:    Patient ID: Hannah Walsh, female    DOB: 1990-10-19, 28 y.o.   MRN: [id number]  HPI  Virtual Visit via Video Note  I connected with Hannah Walsh on 10/30/18 at  9:40 AM EDT by a video enabled telemedicine application and verified that I am speaking with the correct person using two identifiers.  Location: Patient: Home Provider: Office   I discussed the limitations of evaluation and management by telemedicine and the availability of in person appointments. The patient expressed understanding and agreed to proceed.  History of Present Illness:  Hannah Walsh is a 28 year old female who presents today with a chief complaint of foreign body sensation.   She has an intermittent sensation of foreign body stuck in her throat when swallowing. This began a few weeks ago. She notices some discomfort when her mouth is dry, otherwise doesn't experience pain. She denies choking, unexplained weight loss, cough, neck swelling, nausea, esophageal reflux. She does have some mild post nasal drip from seasonal allergies. She's tried gargling with warm salt water without much improvement.    Observations/Objective:  Alert and oriented. Appears well, not sickly. Speaking in complete sentences. No cough, no anterior neck swelling.   Assessment and Plan:  Unclear etiology but differential diagnoses include GERD and post nasal drip from seasonal allergies.   Will start with omeprazole 20 mg once daily at bedtime for a minimum of two weeks. Also discussed the use of antihistamines.  If no improvement then consider ENT evaluation, she agreed and will update.  Follow Up Instructions:  Start omeprazole 20 mg once every evening at bedtime for the fullness sensation to your throat. Do this for at least 2 weeks.  Please update me if no improvement as discussed.  It was a pleasure to see you today! Allie Bossier, NP-C    I discussed the assessment and treatment plan with the patient. The  patient was provided an opportunity to ask questions and all were answered. The patient agreed with the plan and demonstrated an understanding of the instructions.   The patient was advised to call back or seek an in-person evaluation if the symptoms worsen or if the condition fails to improve as anticipated.     Pleas Koch, NP    Review of Systems  Constitutional: Negative for fever.  HENT: Positive for postnasal drip. Negative for congestion and trouble swallowing.        Foreign body sensation to throat upon swallowing  Respiratory: Negative for cough and shortness of breath.   Cardiovascular: Negative for chest pain.  Gastrointestinal: Negative for abdominal pain, nausea and vomiting.  Allergic/Immunologic: Positive for environmental allergies.       Past Medical History:  Diagnosis Date  . Abnormal chromosomal and genetic finding on antenatal screening of mother   . Hemorrhoids   . Malignant melanoma of skin ([place]) 02/12/2018  . Melanoma ([place])   . PONV (postoperative nausea and vomiting)   . Single liveborn, born in hospital, delivered by vaginal delivery 01/08/2018   Had a daughter 8lb 3oz  . Varicose vein of leg      Social History   Socioeconomic History  . Marital status: Single    Spouse name: Not on file  . Number of children: Not on file  . Years of education: Not on file  . Highest education level: Not on file  Occupational History  . Not on file  Social Needs  . Financial resource strain: Not  on file  . Food insecurity:    Worry: Not on file    Inability: Not on file  . Transportation needs:    Medical: Not on file    Non-medical: Not on file  Tobacco Use  . Smoking status: Never Smoker  . Smokeless tobacco: Never Used  Substance and Sexual Activity  . Alcohol use: No  . Drug use: No  . Sexual activity: Yes    Partners: Male    Birth control/protection: Pill  Lifestyle  . Physical activity:    Days per week: Not on file    Minutes per  session: Not on file  . Stress: Not on file  Relationships  . Social connections:    Talks on phone: Not on file    Gets together: Not on file    Attends religious service: Not on file    Active member of club or organization: Not on file    Attends meetings of clubs or organizations: Not on file    Relationship status: Not on file  . Intimate partner violence:    Fear of current or ex partner: Not on file    Emotionally abused: Not on file    Physically abused: Not on file    Forced sexual activity: Not on file  Other Topics Concern  . Not on file  Social History Narrative   Married.   1 child.   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    Past Surgical History:  Procedure Laterality Date  . excision of melanoma      Family History  Problem Relation Age of Onset  . Hyperlipidemia Mother   . Heart disease Father 72       MI  . Hyperlipidemia Father   . Heart disease Maternal Grandfather 30       died of MI  . Hyperlipidemia Maternal Grandfather   . Breast cancer Maternal Grandmother   . Hyperlipidemia Maternal Grandmother   . Diabetes Maternal Grandmother   . Cervical cancer Maternal Grandmother   . Hyperlipidemia Paternal Grandmother   . Lung cancer Paternal Grandmother   . Heart disease Paternal Grandfather   . Hyperlipidemia Paternal Grandfather     No Known Allergies  Current Outpatient Medications on File Prior to Visit  Medication Sig Dispense Refill  . norethindrone (ORTHO MICRONOR) 0.35 MG tablet Take 1 tablet (0.35 mg total) by mouth daily. 1 Package 0   No current facility-administered medications on file prior to visit.     There were no vitals taken for this visit.   Objective:   Physical Exam  Constitutional: She is oriented to person, place, and time. She appears well-nourished.  HENT:  Mouth/Throat: Oropharynx is clear and moist.  Neck: Neck supple.  Respiratory: Effort normal.  Neurological: She is alert and  oriented to person, place, and time.  Psychiatric: She has a normal mood and affect.           Assessment & Plan:

## 2018-10-30 NOTE — Assessment & Plan Note (Signed)
Unclear etiology but differential diagnoses include GERD and post nasal drip from seasonal allergies.   Will start with omeprazole 20 mg once daily at bedtime for a minimum of two weeks. Also discussed the use of antihistamines.  If no improvement then consider ENT evaluation, she agreed and will update.

## 2018-10-30 NOTE — Patient Instructions (Signed)
Start omeprazole 20 mg once every evening at bedtime for the fullness sensation to your throat. Do this for at least 2 weeks.  Please update me if no improvement as discussed.  It was a pleasure to see you today! Allie Bossier, NP-C

## 2019-04-01 IMAGING — US US MFM FETAL NUCHAL TRANSLUCENCY
1 series · 15 of 28 positions shown · non-contrast
Comparison: none

[Series 1: us mfm fetal nuchal translucency · 15 of 47 slices shown]
[im 1/47]
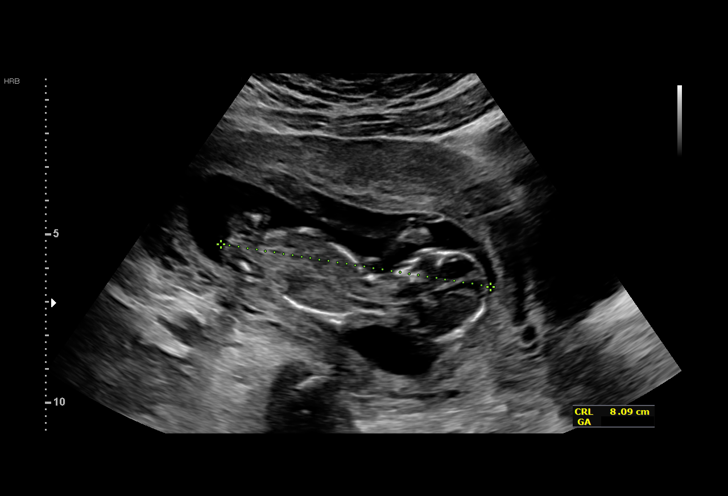
[im 4/47]
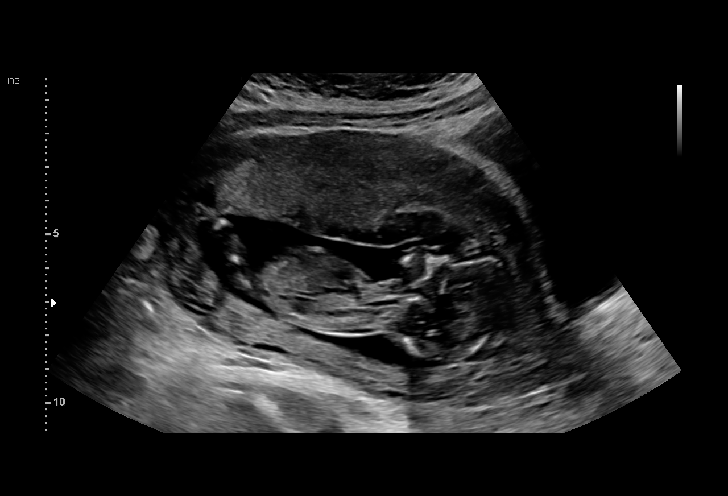
[im 7/47]
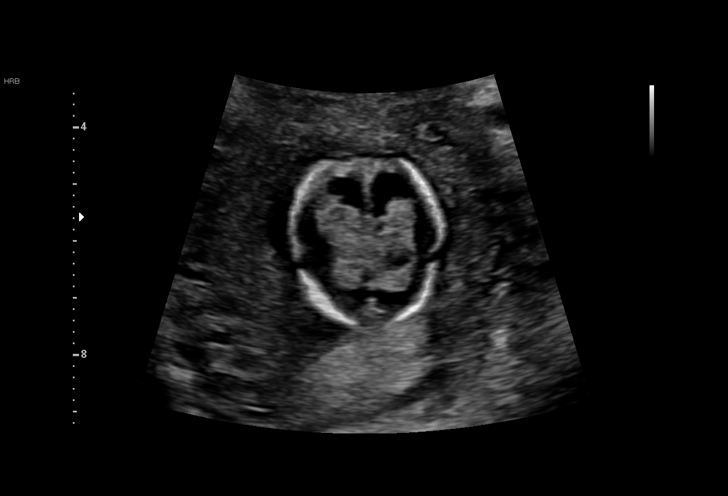
[im 11/47]
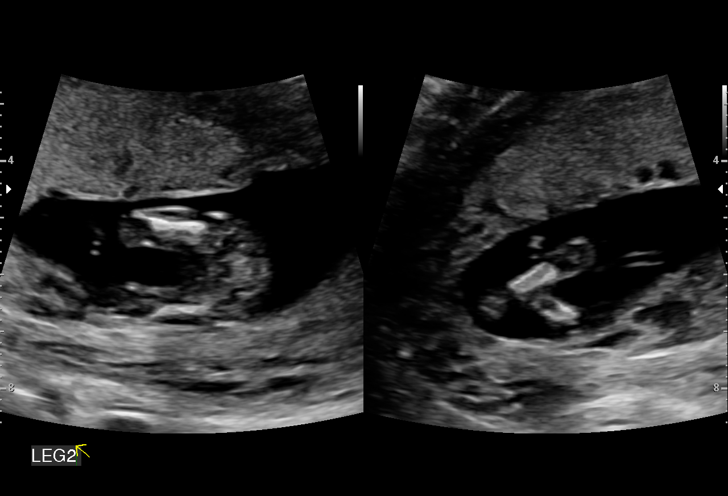
[im 14/47]
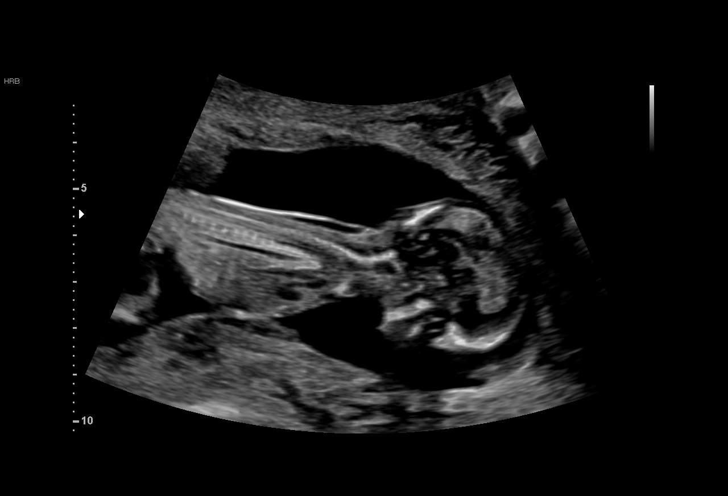
[im 18/47]
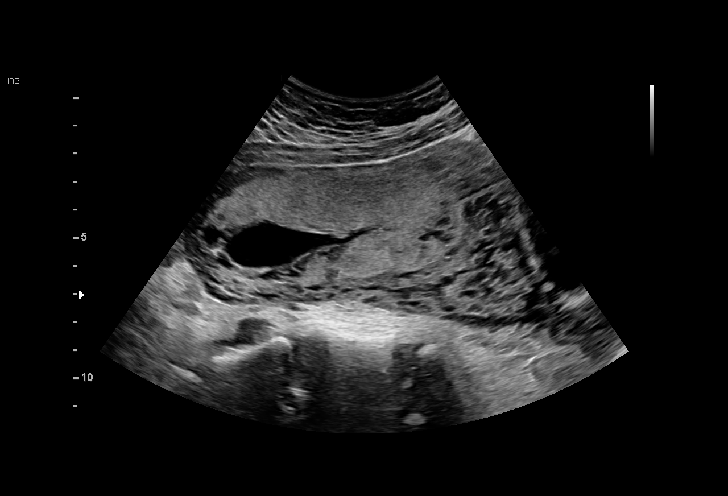
[im 21/47]
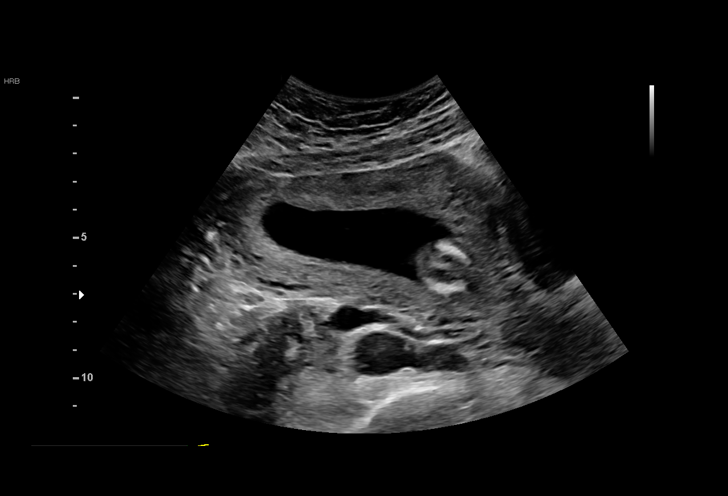
[im 24/47]
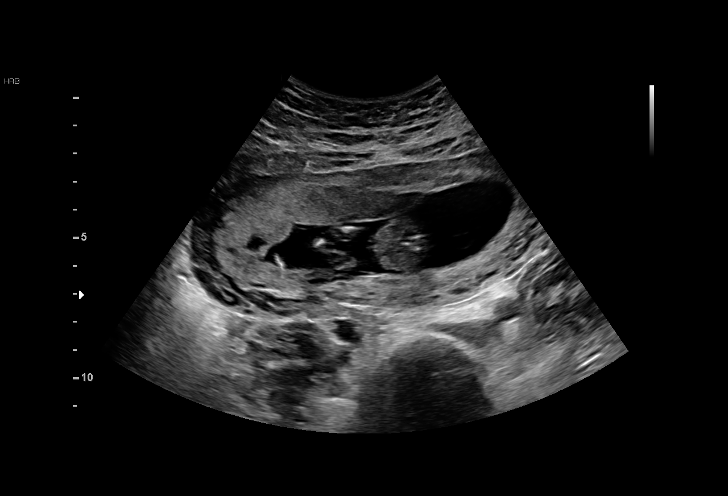
[im 26/47]
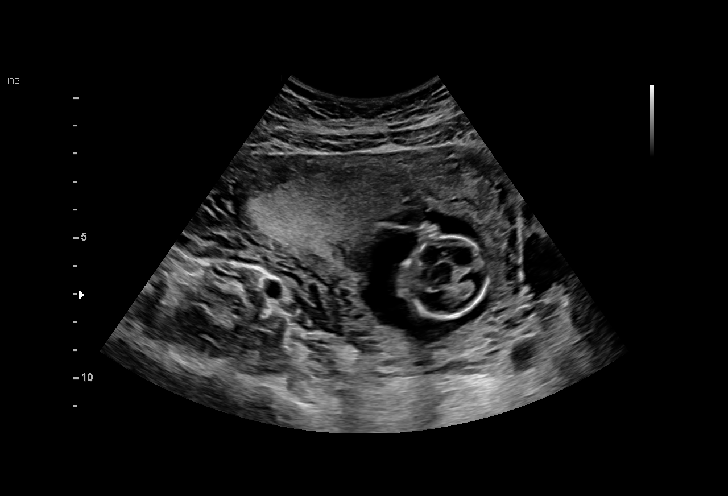
[im 29/47]
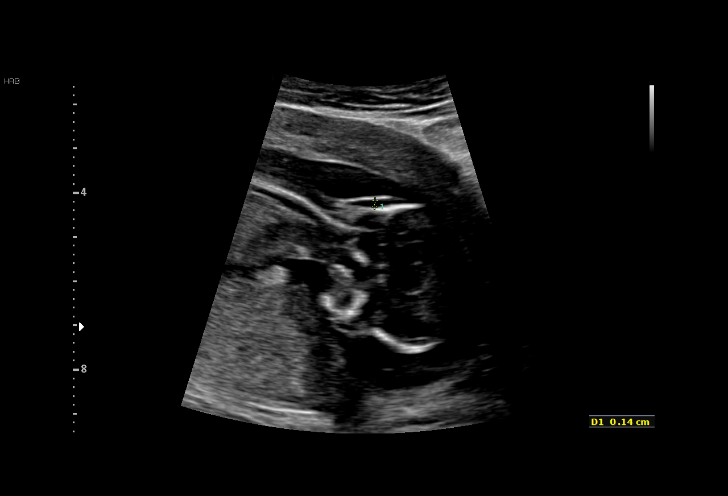
[im 33/47]
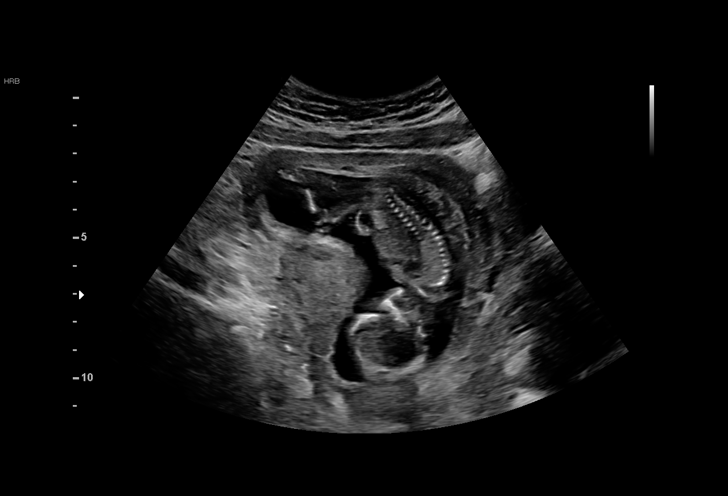
[im 36/47]
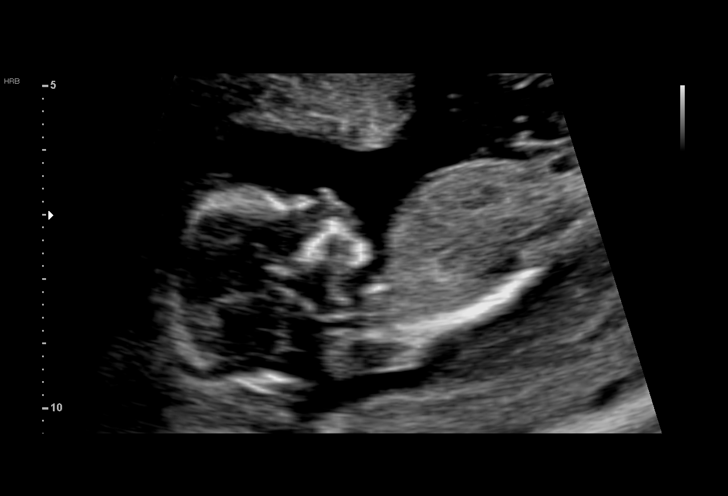
[im 40/47]
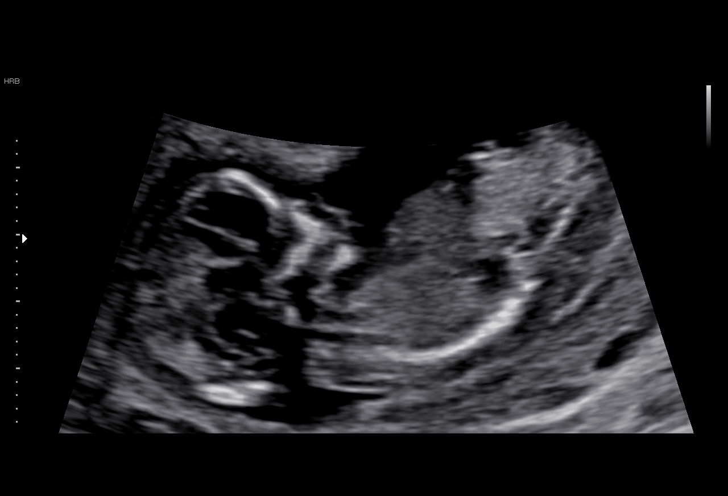
[im 43/47]
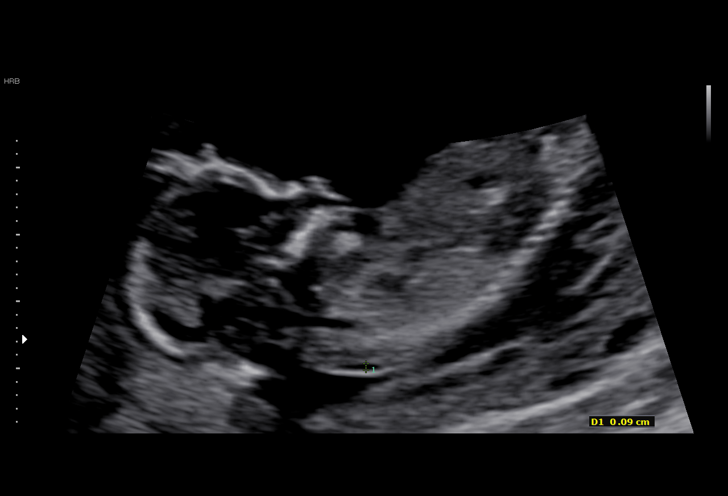
[im 47/47]
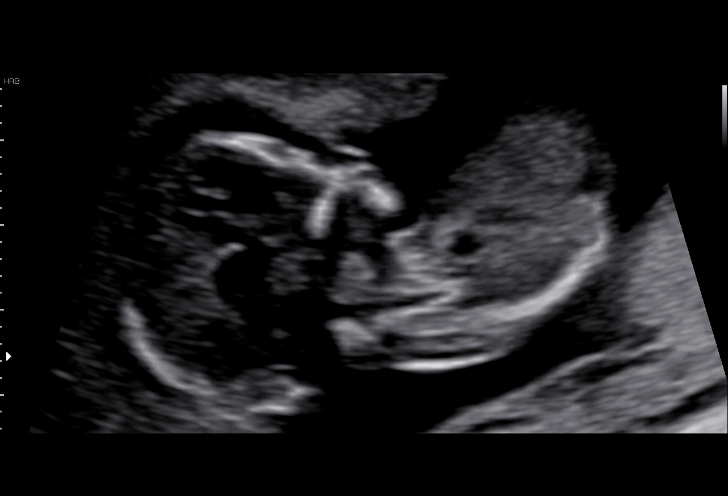

[15 of 28 positions shown; findings below may reference images not displayed]

OBGYN

TRANSLUCENCY

1  HEINZ DEGOLLADO             [id number]      [id number]     [id number]
Indications

13 weeks gestation of pregnancy
Encounter for nuchal translucency
Abnormal biochemical screen ([facility]) (T-18,
T-13)

OB History

Blood Type:            Height:  5'1"   Weight (lb):  134       BMI:
Gravidity:    1
Fetal Evaluation

Num Of Fetuses:     1
Fetal Heart         161
Rate(bpm):
Cardiac Activity:   Observed

Amniotic Fluid
AFI FV:      Subjectively within normal limits
Gestational Age

Best:          13w 5d     Det. By:  Early Ultrasound         EDD:   01/08/18
(06/06/17)
1st Trimester Genetic Sonogram Screening

CRL:            80.8  mm    G. Age:   13w 5d                 EDD:   01/08/18

Nasal Bone:                 Present
Anatomy

Choroid Plexus:        Left choroid           Stomach:                Appears normal, left
plexus cyst, 2.6mm
sided
Thoracic:              Appears normal         Bladder:                Appears normal
Cervix Uterus Adnexa

Cervix
Normal appearance by transabdominal scan.

Uterus
No abnormality visualized.

Left Ovary
No adnexal mass visualized.

Right Ovary
Within normal limits.

Cul De Sac:   No free fluid seen.
Impression

SIUP at 41w2d
incidental note is made of an apparent left choroid plexus
cyst and Abedrrahim appearing choroid plexus bilaterally
NT =0.9mm
nasal bone is present
Recommendations

1. see genetic counseling
2. fetal survey at 18 weeks.
3. first trimester analytes drawn in context of low fetal fraction
by NIPS today

## 2019-04-09 ENCOUNTER — Telehealth: Payer: Self-pay

## 2019-04-09 NOTE — Telephone Encounter (Signed)
Noted and agree. 

## 2019-04-09 NOTE — Telephone Encounter (Signed)
[facility] Night - Client TELEPHONE ADVICE RECORD [facility] Patient Name: Hannah Walsh Gender: Female DOB: Nov 28, 1990 Age: 28 Y 3 M 22 D Return Phone Number: [phone] (Primary) Address: City/State/Zip: [place] [address] Client [facility] Night - Client Client Site [place] Physician Alma Friendly - NP Contact Type Call Who Is Calling Patient / Member / Family / Caregiver Call Type Triage / Clinical Relationship To Patient Self Return Phone Number [phone] (Primary) Chief Complaint Infection Exposure (non-symptomatic) Reason for Call Symptomatic / Request for [name] states she was exposed to covid-19. No current symptoms Translation No Nurse Assessment Nurse: Laurena Bering, RN, Helene Kelp Date/Time Eilene Ghazi Time): 04/08/2019 6:18:31 PM Confirm and document reason for call. If symptomatic, describe symptoms. ---Caller states that she was exposed to [place]. She is a Copywriter, advertising. Daughter that is a patient of her stated that her mother tested positive for COVID Caller was wearing all PPE Has the patient had close contact with a person known or suspected to have the novel coronavirus illness OR traveled / lives in area with major community spread (including international travel) in the last 14 days from the onset of symptoms? * If Asymptomatic, screen for exposure and travel within the last 14 days. ---No Does the patient have any new or worsening symptoms? ---Yes Will a triage be completed? ---Yes Related visit to physician within the last 2 weeks? ---No Does the PT have any chronic conditions? (i.e. diabetes, asthma, this includes High risk factors for pregnancy, etc.) ---No Is the patient pregnant or possibly pregnant? (Ask all females between the ages of 56-55) ---No Is this a behavioral health or substance abuse call? ---No Guidelines Guideline Title  Affirmed Question Affirmed Notes Nurse Date/Time (Eastern Time) Coronavirus (COVID-19) - Exposure [1] COVID-19 EXPOSURE (Close Contact) AND [2] within last 14 days BUT [3] NO symptoms Milton Ferguson 04/08/2019 6:21:45 PM PLEASE NOTE: All timestamps contained within this report are represented as [place] Standard Time. CONFIDENTIALTY NOTICE: This fax transmission is intended only for the addressee. It contains information that is legally privileged, confidential or otherwise protected from use or disclosure. If you are not the intended recipient, you are strictly prohibited from reviewing, disclosing, copying using or disseminating any of this information or taking any action in reliance on or regarding this information. If you have received this fax in error, please notify us immediately by telephone so that we can arrange for its return to [place]. Phone: [phone], Toll-Free: [phone], Fax: [phone] Page: 2 of 2 Call Id: [id number] [place]. Time Eilene Ghazi Time) Disposition Final User 04/08/2019 6:12:38 PM Send To RN Personal Vertell Limber, RN, Quillian Quince 04/08/2019 6:22:40 PM [place], RN, [place] Artist Disagree/Comply Comply Caller Understands Yes PreDisposition Call Doctor Care Advice Given Per Guideline HOME CARE: * You should be able to treat this at home. REASSURANCE AND EDUCATION - EXPOSED, NO SYMPTOMS, LESS THAN 14 DAYS: * Although you were exposed to COVID-19, you do not currently have any of the common symptoms of COVID-19 infection, such as: cough, fever, and shortness of breath. * COVID-19 starts within 14 days of exposure. MEASURE TEMPERATURE: * Watch for symptoms of cough and fever. * Measure your temperature 2 times each day, until 14 days after exposure. * Report any fever or other concerning symptoms to your healthcare provider. CALL BACK (OR CALL YOUR HEALTHCARE PROVIDER) IF: * Fever or feeling feverish occurs within 14 days of COVID-19 exposure. * Cough  or  difficulty breathing occur within 14 days of COVID-19 exposure. * Other symptoms of COVID-19 infection occur. * You have more questions. CARE ADVICE given per Coronavirus (COVID-19) - Exposure (Adult) guideline.

## 2019-09-10 ENCOUNTER — Ambulatory Visit: Payer: 59 | Attending: Internal Medicine

## 2019-09-10 DIAGNOSIS — Z23 Encounter for immunization: Secondary | ICD-10-CM

## 2019-09-10 NOTE — Progress Notes (Signed)
   Covid-19 Vaccination Clinic  Name:  Hannah Walsh    MRN: [id number] DOB: 08-27-1990  09/10/2019  [name] was observed post Covid-19 immunization for 15 minutes without incident. She was provided with Vaccine Information Sheet and instruction to access the V-Safe system.   [name] was instructed to call 911 with any severe reactions post vaccine: Marland Kitchen Difficulty breathing  . Swelling of face and throat  . A fast heartbeat  . A bad rash all over body  . Dizziness and weakness   Immunizations Administered    Name Date Dose VIS Date Route   Pfizer COVID-19 Vaccine 09/10/2019 11:53 AM 0.3 mL 06/11/2019 Intramuscular   Manufacturer: [place]   Lot: [id number]   [place]: [id number]

## 2019-10-06 ENCOUNTER — Ambulatory Visit: Payer: 59 | Attending: Internal Medicine

## 2019-10-06 DIAGNOSIS — Z23 Encounter for immunization: Secondary | ICD-10-CM

## 2019-10-06 NOTE — Progress Notes (Signed)
   Covid-19 Vaccination Clinic  Name:  MYLEIGHA RAUSCH    MRN: [id number] DOB: [birth year]  10/06/2019  Ms. Moeser was observed post Covid-19 immunization for 15 minutes without incident. She was provided with Vaccine Information Sheet and instruction to access the V-Safe system.   Ms. Spieker was instructed to call 911 with any severe reactions post vaccine: Marland Kitchen Difficulty breathing  . Swelling of face and throat  . A fast heartbeat  . A bad rash all over body  . Dizziness and weakness   Immunizations Administered    Name Date Dose VIS Date Route   Pfizer COVID-19 Vaccine 10/06/2019  1:01 PM 0.3 mL 06/11/2019 Intramuscular   Manufacturer: [place]   Lot: [id number]   [place]: [id number]

## 2020-01-07 ENCOUNTER — Ambulatory Visit (INDEPENDENT_AMBULATORY_CARE_PROVIDER_SITE_OTHER): Payer: 59 | Admitting: Primary Care

## 2020-01-07 ENCOUNTER — Other Ambulatory Visit: Payer: Self-pay

## 2020-01-07 ENCOUNTER — Telehealth: Payer: Self-pay

## 2020-01-07 ENCOUNTER — Encounter: Payer: Self-pay | Admitting: Primary Care

## 2020-01-07 VITALS — BP 120/78 | HR 82 | Temp 96.4°F | Ht 61.0 in | Wt 132.5 lb

## 2020-01-07 DIAGNOSIS — Z0000 Encounter for general adult medical examination without abnormal findings: Secondary | ICD-10-CM | POA: Diagnosis not present

## 2020-01-07 DIAGNOSIS — Z1159 Encounter for screening for other viral diseases: Secondary | ICD-10-CM | POA: Diagnosis not present

## 2020-01-07 LAB — COMPREHENSIVE METABOLIC PANEL
ALT: 13 U/L (ref 0–35)
AST: 15 U/L (ref 0–37)
Albumin: 4.5 g/dL (ref 3.5–5.2)
Alkaline Phosphatase: 30 U/L — ABNORMAL LOW (ref 39–117)
BUN: 11 mg/dL (ref 6–23)
CO2: 28 mEq/L (ref 19–32)
Calcium: 9.6 mg/dL (ref 8.4–10.5)
Chloride: 103 mEq/L (ref 96–112)
Creatinine, Ser: 0.66 mg/dL (ref 0.40–1.20)
GFR: 106.59 mL/min (ref >60.00)
Glucose, Bld: 77 mg/dL (ref 70–99)
Potassium: 4.3 mEq/L (ref 3.5–5.1)
Sodium: 138 mEq/L (ref 135–145)
Total Bilirubin: 0.6 mg/dL (ref 0.2–1.2)
Total Protein: 7.2 g/dL (ref 6.0–8.3)

## 2020-01-07 LAB — CBC
HCT: 40.1 % (ref 36.0–46.0)
Hemoglobin: 13.8 g/dL (ref 12.0–15.0)
MCHC: 34.4 g/dL (ref 30.0–36.0)
MCV: 90.2 fl (ref 78.0–100.0)
Platelets: 289 10*3/uL (ref 150.0–400.0)
RBC: 4.45 Mil/uL (ref 3.87–5.11)
RDW: 12.5 % (ref 11.5–15.5)
WBC: 7.1 10*3/uL (ref 4.0–10.5)

## 2020-01-07 LAB — LIPID PANEL
Cholesterol: 159 mg/dL (ref 0–200)
HDL: 59.1 mg/dL (ref >39.00)
LDL Cholesterol: 86 mg/dL (ref 0–99)
NonHDL: 99.56
Total CHOL/HDL Ratio: 3
Triglycerides: 68 mg/dL (ref 0.0–149.0)
VLDL: 13.6 mg/dL (ref 0.0–40.0)

## 2020-01-07 NOTE — Patient Instructions (Signed)
Stop by the lab prior to leaving today. I will notify you of your results once received.   Start exercising. You should be getting 150 minutes of moderate intensity exercise weekly.  Continue to work on a healthy diet. Ensure you are consuming 64 ounces of water daily.  It was a pleasure to see you today!   Preventive Care 17-28 Years Old, Female Preventive care refers to visits with your health care provider and lifestyle choices that can promote health and wellness. This includes:  A yearly physical exam. This may also be called an annual well check.  Regular dental visits and eye exams.  Immunizations.  Screening for certain conditions.  Healthy lifestyle choices, such as eating a healthy diet, getting regular exercise, not using drugs or products that contain nicotine and tobacco, and limiting alcohol use. What can I expect for my preventive care visit? Physical exam Your health care provider will check your:  Height and weight. This may be used to calculate body mass index (BMI), which tells if you are at a healthy weight.  Heart rate and blood pressure.  Skin for abnormal spots. Counseling Your health care provider may ask you questions about your:  Alcohol, tobacco, and drug use.  Emotional well-being.  Home and relationship well-being.  Sexual activity.  Eating habits.  Work and work Statistician.  Method of birth control.  Menstrual cycle.  Pregnancy history. What immunizations do I need?  Influenza (flu) vaccine  This is recommended every year. Tetanus, diphtheria, and pertussis (Tdap) vaccine  You may need a Td booster every 10 years. Varicella (chickenpox) vaccine  You may need this if you have not been vaccinated. Human papillomavirus (HPV) vaccine  If recommended by your health care provider, you may need three doses over 6 months. Measles, mumps, and rubella (MMR) vaccine  You may need at least one dose of MMR. You may also need a second  dose. Meningococcal conjugate (MenACWY) vaccine  One dose is recommended if you are age 28-21 years and a first-year college student living in a residence hall, or if you have one of several medical conditions. You may also need additional booster doses. Pneumococcal conjugate (PCV13) vaccine  You may need this if you have certain conditions and were not previously vaccinated. Pneumococcal polysaccharide (PPSV23) vaccine  You may need one or two doses if you smoke cigarettes or if you have certain conditions. Hepatitis A vaccine  You may need this if you have certain conditions or if you travel or work in places where you may be exposed to hepatitis A. Hepatitis B vaccine  You may need this if you have certain conditions or if you travel or work in places where you may be exposed to hepatitis B. Haemophilus influenzae type b (Hib) vaccine  You may need this if you have certain conditions. You may receive vaccines as individual doses or as more than one vaccine together in one shot (combination vaccines). Talk with your health care provider about the risks and benefits of combination vaccines. What tests do I need?  Blood tests  Lipid and cholesterol levels. These may be checked every 5 years starting at age 11.  Hepatitis C test.  Hepatitis B test. Screening  Diabetes screening. This is done by checking your blood sugar (glucose) after you have not eaten for a while (fasting).  Sexually transmitted disease (STD) testing.  BRCA-related cancer screening. This may be done if you have a family history of breast, ovarian, tubal, or peritoneal cancers.  Pelvic exam and Pap test. This may be done every 3 years starting at age 41. Starting at age 40, this may be done every 5 years if you have a Pap test in combination with an HPV test. Talk with your health care provider about your test results, treatment options, and if necessary, the need for more tests. Follow these instructions at  home: Eating and drinking   Eat a diet that includes fresh fruits and vegetables, whole grains, lean protein, and low-fat dairy.  Take vitamin and mineral supplements as recommended by your health care provider.  Do not drink alcohol if: ? Your health care provider tells you not to drink. ? You are pregnant, may be pregnant, or are planning to become pregnant.  If you drink alcohol: ? Limit how much you have to 0-1 drink a day. ? Be aware of how much alcohol is in your drink. In the [place] one drink equals one 12 oz bottle of beer (355 mL), one 5 oz glass of wine (148 mL), or one 1 oz glass of hard liquor (44 mL). Lifestyle  Take daily care of your teeth and gums.  Stay active. Exercise for at least 30 minutes on 5 or more days each week.  Do not use any products that contain nicotine or tobacco, such as cigarettes, e-cigarettes, and chewing tobacco. If you need help quitting, ask your health care provider.  If you are sexually active, practice safe sex. Use a condom or other form of birth control (contraception) in order to prevent pregnancy and STIs (sexually transmitted infections). If you plan to become pregnant, see your health care provider for a preconception visit. What's next?  Visit your health care provider once a year for a well check visit.  Ask your health care provider how often you should have your eyes and teeth checked.  Stay up to date on all vaccines. This information is not intended to replace advice given to you by your health care provider. Make sure you discuss any questions you have with your health care provider. Document Revised: 02/26/2018 Document Reviewed: 02/26/2018 Elsevier Patient Education  2020 [employer].

## 2020-01-07 NOTE — Progress Notes (Signed)
Subjective:    Patient ID: Hannah Walsh, female    DOB: [birth year], 29 y.o.   MRN: [id number]  HPI  This visit occurred during the SARS-CoV-2 public health emergency.  Safety protocols were in place, including screening questions prior to the visit, additional usage of staff PPE, and extensive cleaning of exam room while observing appropriate contact time as indicated for disinfecting solutions.   Ms. Hannah Walsh is a 29 year old female who presents today for complete physical.  Immunizations: -Tetanus: Completed in 2019 -Influenza: Completed last season  -Covid-19: Completed series  Diet: She endorses a fair diet. Exercise: She is not exercising  Eye exam: No recent eye exam Dental exam: Completes semi-annually   Pap Smear: Follows with GYN  BP Readings from Last 3 Encounters:  01/07/20 120/78  07/31/18 110/72  01/12/18 130/89     Review of Systems  Constitutional: Negative for unexpected weight change.  HENT: Negative for rhinorrhea.   Respiratory: Negative for cough and shortness of breath.   Cardiovascular: Negative for chest pain.  Gastrointestinal: Negative for constipation and diarrhea.  Genitourinary: Negative for difficulty urinating and menstrual problem.  Musculoskeletal: Negative for arthralgias and myalgias.  Skin: Negative for rash.  Allergic/Immunologic: Negative for environmental allergies.  Neurological: Negative for dizziness, numbness and headaches.  Psychiatric/Behavioral: The patient is not nervous/anxious.        Past Medical History:  Diagnosis Date  . Abnormal chromosomal and genetic finding on antenatal screening of mother   . Hemorrhoids   . Malignant melanoma of skin ([place]) 02/12/2018  . Melanoma ([place])   . PONV (postoperative nausea and vomiting)   . Single liveborn, born in hospital, delivered by vaginal delivery 01/08/2018   Had a daughter 8lb 3oz  . Varicose vein of leg      Social History   Socioeconomic History  . Marital status:  Married    Spouse name: Not on file  . Number of children: Not on file  . Years of education: Not on file  . Highest education level: Not on file  Occupational History  . Not on file  Tobacco Use  . Smoking status: Never Smoker  . Smokeless tobacco: Never Used  Vaping Use  . Vaping Use: Never used  Substance and Sexual Activity  . Alcohol use: No  . Drug use: No  . Sexual activity: Yes    Partners: Male    Birth control/protection: Pill  Other Topics Concern  . Not on file  Social History Narrative   Married.   1 child.   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   Social Determinants of Health   Financial Resource Strain:   . Difficulty of Paying Living Expenses:   Food Insecurity:   . Worried About Charity fundraiser in the Last Year:   . Arboriculturist in the Last Year:   Transportation Needs:   . Film/video editor (Medical):   Marland Kitchen Lack of Transportation (Non-Medical):   Physical Activity:   . Days of Exercise per Week:   . Minutes of Exercise per Session:   Stress:   . Feeling of Stress :   Social Connections:   . Frequency of Communication with Friends and Family:   . Frequency of Social Gatherings with Friends and Family:   . Attends Religious Services:   . Active Member of Clubs or Organizations:   . Attends Archivist Meetings:   Marland Kitchen Marital Status:   Intimate  Partner Violence:   . Fear of Current or Ex-Partner:   . Emotionally Abused:   Marland Kitchen Physically Abused:   . Sexually Abused:     Past Surgical History:  Procedure Laterality Date  . excision of melanoma      Family History  Problem Relation Age of Onset  . Hyperlipidemia Mother   . Heart disease Father 48       MI  . Hyperlipidemia Father   . Heart disease Maternal Grandfather 39       died of MI  . Hyperlipidemia Maternal Grandfather   . Breast cancer Maternal Grandmother   . Hyperlipidemia Maternal Grandmother   . Diabetes Maternal Grandmother   .  Cervical cancer Maternal Grandmother   . Hyperlipidemia Paternal Grandmother   . Lung cancer Paternal Grandmother   . Heart disease Paternal Grandfather   . Hyperlipidemia Paternal Grandfather     No Known Allergies  Current Outpatient Medications on File Prior to Visit  Medication Sig Dispense Refill  . norethindrone (ORTHO MICRONOR) 0.35 MG tablet Take 1 tablet (0.35 mg total) by mouth daily. 1 Package 0   No current facility-administered medications on file prior to visit.    BP 120/78   Pulse 82   Temp (!) 96.4 F (35.8 C) (Temporal)   Ht 5\' 1"  (1.549 m)   Wt 132 lb 8 oz (60.1 kg)   LMP 12/08/2019 (Within Days)   SpO2 98%   BMI 25.04 kg/m    Objective:   Physical Exam HENT:     Right Ear: Tympanic membrane and ear canal normal.     Left Ear: Tympanic membrane and ear canal normal.  Eyes:     Pupils: Pupils are equal, round, and reactive to light.  Cardiovascular:     Rate and Rhythm: Normal rate and regular rhythm.  Pulmonary:     Effort: Pulmonary effort is normal.     Breath sounds: Normal breath sounds.  Abdominal:     General: Bowel sounds are normal.     Palpations: Abdomen is soft.     Tenderness: There is no abdominal tenderness.  Musculoskeletal:        General: Normal range of motion.     Cervical back: Neck supple.  Skin:    General: Skin is warm and dry.  Neurological:     Mental Status: She is alert and oriented to person, place, and time.     Cranial Nerves: No cranial nerve deficit.     Deep Tendon Reflexes:     Reflex Scores:      Patellar reflexes are 2+ on the right side and 2+ on the left side.           Assessment & Plan:

## 2020-01-07 NOTE — Assessment & Plan Note (Signed)
Immunizations UTD. Pap smear UTD. Encouraged a healthy diet and regular exercise. Exam today benign.  Labs pending.

## 2020-01-07 NOTE — Telephone Encounter (Signed)
Called patient and screened for covid.

## 2020-01-07 NOTE — Telephone Encounter (Signed)
[facility] Night - Client Nonclinical Telephone Record AccessNurse Client [facility] Night - Client Client Site [facility] Physician Alma Friendly - NP Contact Type Call Who Is Calling Patient / Member / Family / Caregiver Caller Name Sakara Lehtinen Phone Number [phone] Call Type Message Only Information Provided Reason for Call Returning a Call from the Office Initial [place] is returning a call from the office regarding covid screening questions. Additional Comment Disp. Time Disposition Final User 01/06/2020 5:58:53 PM General Information Provided Yes Renato Shin Call Closed By: Renato Shin Transaction Date/Time: 01/06/2020 5:57:24 PM (ET)

## 2020-01-10 LAB — HEPATITIS C ANTIBODY
Hepatitis C Ab: NONREACTIVE
SIGNAL TO CUT-OFF: 0.01 (ref <1.00)

## 2021-01-12 ENCOUNTER — Encounter: Payer: 59 | Admitting: Primary Care

## 2021-01-19 LAB — OB RESULTS CONSOLE RUBELLA ANTIBODY, IGM: Rubella: IMMUNE

## 2021-01-19 LAB — OB RESULTS CONSOLE ANTIBODY SCREEN: Antibody Screen: NEGATIVE

## 2021-01-19 LAB — OB RESULTS CONSOLE GC/CHLAMYDIA
Chlamydia: NEGATIVE
Chlamydia: NEGATIVE
Gonorrhea: NEGATIVE

## 2021-01-19 LAB — OB RESULTS CONSOLE RPR: RPR: NONREACTIVE

## 2021-01-19 LAB — OB RESULTS CONSOLE ABO/RH: RH Type: POSITIVE

## 2021-01-19 LAB — OB RESULTS CONSOLE HIV ANTIBODY (ROUTINE TESTING): HIV: NONREACTIVE

## 2021-01-19 LAB — RESULTS CONSOLE HPV: CHL HPV: NEGATIVE

## 2021-01-19 LAB — HEPATITIS C ANTIBODY: HCV Ab: NEGATIVE

## 2021-01-19 LAB — HM PAP SMEAR

## 2021-01-19 LAB — OB RESULTS CONSOLE HEPATITIS B SURFACE ANTIGEN: Hepatitis B Surface Ag: NEGATIVE

## 2021-02-05 ENCOUNTER — Encounter: Payer: Self-pay | Admitting: Primary Care

## 2021-04-09 ENCOUNTER — Other Ambulatory Visit: Payer: Self-pay

## 2021-04-09 ENCOUNTER — Ambulatory Visit
Admission: RE | Admit: 2021-04-09 | Discharge: 2021-04-09 | Disposition: A | Discharge status: Home / Self Care | Payer: 59 | Source: Ambulatory Visit | Attending: Primary Care | Admitting: Primary Care

## 2021-04-09 VITALS — BP 122/90 | HR 115 | Temp 98.3°F | Resp 20

## 2021-04-09 DIAGNOSIS — H6982 Other specified disorders of Eustachian tube, left ear: Secondary | ICD-10-CM

## 2021-04-09 NOTE — Discharge Instructions (Addendum)
Apply warm compresses to neck bilaterally  Use Flonase as needed  If any worsening pain, redness/swelling behind the ear, new/worse discharge to the ear, fever please return for recheck

## 2021-04-09 NOTE — ED Triage Notes (Signed)
Pt here with left ear pain x 3 days. Pt is pregnant and 1/28

## 2021-04-09 NOTE — ED Provider Notes (Signed)
Chief Complaint   Chief Complaint  Patient presents with   Otalgia     Subjective, HPI  Hannah Walsh is a very pleasant 30 y.o. female who presents with left ear pain for the last 3 days.  Patient reports a recent cold. Patient is pregnant and due on January 28th.  No OTC remedies used.  Patient's problem list, past medical and social history, medications, and allergies were reviewed by me and updated in Epic.   ROS  See HPI.  Objective   Vitals:   04/09/21 1343  BP: 122/90  Pulse: (!) 115  Resp: 20  Temp: 98.3 F (36.8 C)  SpO2: 98%     General: Appears well-developed and well-nourished. No acute distress. HEENT Head: Normocephalic and atraumatic. Eyes: Conjunctivae and EOM are normal. No eye drainage or scleral icterus bilaterally. Ears: Bilateral: Hearing grossly intact. No drainage or visible deformity. No mastoid erythema, edema, or tenderness. Right: Mild bulging to TM with clear effusion present.  No erythema. Left: Moderate bulging to TM with clear effusion present.  No erythema. Nose: No nasal deviation. Mouth/Throat: No stridor or tracheal deviation. No posterior oropharyngeal erythema, edema, or exudate.  Neck: Normal range of motion, neck is supple. Cardiovascular: Normal rate Pulm/Chest: No respiratory distress. No accessory muscle usage, speaking in full sentences.  Musculoskeletal: No joint deformity, normal range of motion.  Skin: Skin is warm and dry.   Vital signs and nursing note reviewed.    Assessment & Plan  1. Eustachian tube dysfunction, left  30 y.o. female presents with left ear pain for the last 3 days.  Patient reports a recent cold. Patient is pregnant and due on January 28th.  No OTC remedies used.  Given symptoms along with assessment findings, likely eustachian tube dysfunction.  Advised to use warm compresses to the neck bilaterally and advised that she may also try the use of Flonase.  Advised to return with any worsening pain,  redness or swelling behind the ear, new or worse discharge from the ear or fever.  No concern for underlying infection at this time for which the patient would need antibiotics.  Patient verbalized understanding and agreed with plan.  Patient stable upon discharge.  Return as needed.  Plan:   Discharge Instructions      Apply warm compresses to neck bilaterally  Use Flonase as needed  If any worsening pain, redness/swelling behind the ear, new/worse discharge to the ear, fever please return for recheck         Serafina Royals, [place] 04/09/21 1353

## 2021-04-13 ENCOUNTER — Other Ambulatory Visit: Payer: Self-pay

## 2021-04-13 ENCOUNTER — Encounter: Payer: Self-pay | Admitting: Primary Care

## 2021-04-13 ENCOUNTER — Ambulatory Visit: Payer: 59 | Admitting: Primary Care

## 2021-04-13 VITALS — BP 120/88 | HR 87 | Temp 98.2°F | Ht 61.0 in | Wt 154.0 lb

## 2021-04-13 DIAGNOSIS — L03211 Cellulitis of face: Secondary | ICD-10-CM

## 2021-04-13 HISTORY — DX: Cellulitis of face: L03.211

## 2021-04-13 MED ORDER — CEPHALEXIN 500 MG PO CAPS: 500.0000 mg | ORAL_CAPSULE | Freq: Three times a day (TID) | ORAL | 0 refills | Status: DC

## 2021-04-13 NOTE — Progress Notes (Signed)
Subjective:    Patient ID: Hannah Walsh, female    DOB: 01/22/1991, 30 y.o.   MRN: [id number]  HPI  Hannah Walsh is a very pleasant 30 y.o. female who presents today to discuss left ear pain.   She was evaluated at the Urgent Care on 04/09/21 for left ear pain that began three days prior. She endorsed a recent cold at the time. She is pregnant, due in January 2023. During her Urgent Care visit she was diagnosed with eustachian tube dysfunction, treated with warm compresses and Flonase.   Since her Urgent Care visit she continues to notice a muffled sound, ringing in the ear, and soreness to the outside of her ear. No improvement with Flonase. She's not taken anything else. She's noticed swelling and redness to left outer ear. She's noticed left external ear pain, tenderness, and discomfort with laying on her left side.  She denies fevers, right ear pain.      Review of Systems  Constitutional:  Negative for fever.  HENT:  Positive for ear pain.        Ear fullness, muffled ear sounds         Past Medical History:  Diagnosis Date   Abnormal chromosomal and genetic finding on antenatal screening of mother    Hemorrhoids    Malignant melanoma of skin ([place]) 02/12/2018   Melanoma ([place])    PONV (postoperative nausea and vomiting)    Single liveborn, born in hospital, delivered by vaginal delivery 01/08/2018   Had a daughter 8lb 3oz   Varicose vein of leg     Social History   Socioeconomic History   Marital status: Married    Spouse name: Not on file   Number of children: Not on file   Years of education: Not on file   Highest education level: Not on file  Occupational History   Not on file  Tobacco Use   Smoking status: Never   Smokeless tobacco: Never  Vaping Use   Vaping Use: Never used  Substance and Sexual Activity   Alcohol use: No   Drug use: No   Sexual activity: Yes    Partners: Male    Birth control/protection: Pill  Other Topics Concern   Not on file   Social History Narrative   Married.   1 child.   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   Social Determinants of Health   Financial Resource Strain: Not on file  Food Insecurity: Not on file  Transportation Needs: Not on file  Physical Activity: Not on file  Stress: Not on file  Social Connections: Not on file  Intimate Partner Violence: Not on file    Past Surgical History:  Procedure Laterality Date   excision of melanoma      Family History  Problem Relation Age of Onset   Hyperlipidemia Mother    Heart disease Father 44       MI   Hyperlipidemia Father    Heart disease Maternal Grandfather 63       died of MI   Hyperlipidemia Maternal Grandfather    Breast cancer Maternal Grandmother    Hyperlipidemia Maternal Grandmother    Diabetes Maternal Grandmother    Cervical cancer Maternal Grandmother    Hyperlipidemia Paternal Grandmother    Lung cancer Paternal Grandmother    Heart disease Paternal Grandfather    Hyperlipidemia Paternal Grandfather     No Known Allergies  No current outpatient medications on  file prior to visit.   No current facility-administered medications on file prior to visit.    BP 120/88   Pulse 87   Temp 98.2 F (36.8 C) (Temporal)   Ht 5\' 1"  (1.549 m)   Wt 154 lb (69.9 kg)   SpO2 99%   BMI 29.10 kg/m  Objective:   Physical Exam Constitutional:      Appearance: She is not ill-appearing.  HENT:     Right Ear: Tympanic membrane normal. No swelling or tenderness. There is no impacted cerumen. Tympanic membrane is not injected or erythematous.     Left Ear: Tympanic membrane normal. Swelling and tenderness present. There is no impacted cerumen. Tympanic membrane is not injected or erythematous.     Ears:     Comments: Fluid noted to bilateral canals.  Left ear mildly swollen, red, and warm compared to right.   Neurological:     Mental Status: She is alert.          Assessment & Plan:      This  visit occurred during the SARS-CoV-2 public health emergency.  Safety protocols were in place, including screening questions prior to the visit, additional usage of staff PPE, and extensive cleaning of exam room while observing appropriate contact time as indicated for disinfecting solutions.

## 2021-04-13 NOTE — Assessment & Plan Note (Signed)
Evident today on exam. No infection to tympanic membrane.   Treat with cephalexin 500 mg TID x 7-10 days. Discussed other safe conservative treatment measures.   Return precautions provided.

## 2021-04-13 NOTE — Telephone Encounter (Signed)
Added on today for evaluation

## 2021-04-13 NOTE — Patient Instructions (Signed)
Start cephalexin 500 mg capsules for your left ear.  Take 1 capsule by mouth three times daily for 7-10 days.  Please notify me if no improvement in 3-4 days.   It was a pleasure to see you today!

## 2021-06-11 ENCOUNTER — Encounter: Payer: Self-pay | Admitting: Primary Care

## 2021-07-01 NOTE — L&D Delivery Note (Addendum)
DELIVERY NOTE  Pt complete and at +2 station with urge to push. Epidural controlling pain. Pt pushed and delivered a viable female infant in LOA position. Loose nuchal x1, reduced easily. Anterior and posterior shoulders spontaneously delivered with next two pushes; body easily followed next. Infant placed on mothers abdomen and bulb suction of mouth and nose performed. Cord was then clamped and cut by FOB. Cord blood obtained, 3VC. Baby had a vigorous spontaneous cry noted. Placenta then delivered at 1327 intact. Fundal massage performed and pitocin per protocol. Fundus firm. The following lacerations were noted: BL periurethral & periclitoral. Repaired in routine fashion with 3-0 monocryl. Mother and baby stable. Counts correct. EBL 300cc  Infant time: 43 Gender: female, wants circ Placenta time: 1327 Apgars: 6/9 Weight: pending skin-to-skin

## 2021-07-06 LAB — OB RESULTS CONSOLE GBS: GBS: NEGATIVE

## 2021-07-23 ENCOUNTER — Telehealth (HOSPITAL_COMMUNITY): Payer: Self-pay | Admitting: *Deleted

## 2021-07-23 ENCOUNTER — Encounter (HOSPITAL_COMMUNITY): Payer: Self-pay

## 2021-07-23 ENCOUNTER — Encounter (HOSPITAL_COMMUNITY): Payer: Self-pay | Admitting: *Deleted

## 2021-07-23 NOTE — Telephone Encounter (Signed)
Preadmission screen  

## 2021-07-25 ENCOUNTER — Encounter (HOSPITAL_COMMUNITY): Payer: Self-pay | Admitting: Obstetrics and Gynecology

## 2021-07-27 ENCOUNTER — Other Ambulatory Visit: Payer: Self-pay

## 2021-07-27 ENCOUNTER — Inpatient Hospital Stay (HOSPITAL_COMMUNITY): Payer: 59 | Admitting: Anesthesiology

## 2021-07-27 ENCOUNTER — Inpatient Hospital Stay (HOSPITAL_COMMUNITY)
Admission: AD | Admit: 2021-07-27 | Discharge: 2021-07-28 | DRG: 807 | Disposition: A | Discharge status: Home / Self Care | Payer: 59 | Attending: Obstetrics and Gynecology | Admitting: Obstetrics and Gynecology

## 2021-07-27 ENCOUNTER — Encounter (HOSPITAL_COMMUNITY): Payer: Self-pay | Admitting: Obstetrics and Gynecology

## 2021-07-27 DIAGNOSIS — Z3A39 39 weeks gestation of pregnancy: Secondary | ICD-10-CM | POA: Diagnosis not present

## 2021-07-27 DIAGNOSIS — Z20822 Contact with and (suspected) exposure to covid-19: Secondary | ICD-10-CM | POA: Diagnosis present

## 2021-07-27 DIAGNOSIS — O26893 Other specified pregnancy related conditions, third trimester: Secondary | ICD-10-CM | POA: Diagnosis present

## 2021-07-27 DIAGNOSIS — Z3490 Encounter for supervision of normal pregnancy, unspecified, unspecified trimester: Secondary | ICD-10-CM

## 2021-07-27 LAB — CBC
HCT: 36.2 % (ref 36.0–46.0)
Hemoglobin: 12 g/dL (ref 12.0–15.0)
MCH: 31 pg (ref 26.0–34.0)
MCHC: 33.1 g/dL (ref 30.0–36.0)
MCV: 93.5 fL (ref 80.0–100.0)
Platelets: 205 10*3/uL (ref 150–400)
RBC: 3.87 MIL/uL (ref 3.87–5.11)
RDW: 14.2 % (ref 11.5–15.5)
WBC: 10.3 10*3/uL (ref 4.0–10.5)
nRBC: 0 % (ref 0.0–0.2)

## 2021-07-27 LAB — RESP PANEL BY RT-PCR (FLU A&B, COVID) ARPGX2
Influenza A by PCR: NEGATIVE
Influenza B by PCR: NEGATIVE
SARS Coronavirus 2 by RT PCR: NEGATIVE

## 2021-07-27 LAB — TYPE AND SCREEN
ABO/RH(D): A POS
Antibody Screen: NEGATIVE

## 2021-07-27 LAB — RPR: RPR Ser Ql: NONREACTIVE

## 2021-07-27 MED ORDER — BENZOCAINE-MENTHOL 20-0.5 % EX AERO: 1.0000 "application " | INHALATION_SPRAY | CUTANEOUS | Status: DC | PRN

## 2021-07-27 MED ORDER — BUPIVACAINE HCL (PF) 0.25 % IJ SOLN
INTRAMUSCULAR | Status: DC | PRN
  Administered 2021-07-27: 8 mL via EPIDURAL

## 2021-07-27 MED ORDER — FENTANYL CITRATE (PF) 100 MCG/2ML IJ SOLN
INTRAMUSCULAR | Status: AC
  Filled 2021-07-27: qty 2

## 2021-07-27 MED ORDER — OXYCODONE-ACETAMINOPHEN 5-325 MG PO TABS: 2.0000 | ORAL_TABLET | ORAL | Status: DC | PRN

## 2021-07-27 MED ORDER — ONDANSETRON HCL 4 MG/2ML IJ SOLN: 4.0000 mg | Freq: Four times a day (QID) | INTRAMUSCULAR | Status: DC | PRN

## 2021-07-27 MED ORDER — LIDOCAINE HCL (PF) 1 % IJ SOLN
INTRAMUSCULAR | Status: DC | PRN
  Administered 2021-07-27 (×3): 4 mL via EPIDURAL

## 2021-07-27 MED ORDER — LACTATED RINGERS IV SOLN: INTRAVENOUS | Status: DC

## 2021-07-27 MED ORDER — LACTATED RINGERS IV SOLN
500.0000 mL | INTRAVENOUS | Status: DC | PRN
  Administered 2021-07-27: 1000 mL via INTRAVENOUS

## 2021-07-27 MED ORDER — EPHEDRINE 5 MG/ML INJ: 10.0000 mg | INTRAVENOUS | Status: DC | PRN

## 2021-07-27 MED ORDER — OXYTOCIN-SODIUM CHLORIDE 30-0.9 UT/500ML-% IV SOLN
2.5000 [IU]/h | INTRAVENOUS | Status: DC
  Filled 2021-07-27: qty 500

## 2021-07-27 MED ORDER — DIPHENHYDRAMINE HCL 25 MG PO CAPS: 25.0000 mg | ORAL_CAPSULE | Freq: Four times a day (QID) | ORAL | Status: DC | PRN

## 2021-07-27 MED ORDER — LACTATED RINGERS IV SOLN: 500.0000 mL | INTRAVENOUS | Status: DC | PRN

## 2021-07-27 MED ORDER — ZOLPIDEM TARTRATE 5 MG PO TABS: 5.0000 mg | ORAL_TABLET | Freq: Every evening | ORAL | Status: DC | PRN

## 2021-07-27 MED ORDER — SOD CITRATE-CITRIC ACID 500-334 MG/5ML PO SOLN: 30.0000 mL | ORAL | Status: DC | PRN

## 2021-07-27 MED ORDER — OXYTOCIN BOLUS FROM INFUSION
333.0000 mL | Freq: Once | INTRAVENOUS | Status: AC
  Administered 2021-07-27: 333 mL via INTRAVENOUS

## 2021-07-27 MED ORDER — WITCH HAZEL-GLYCERIN EX PADS: 1.0000 "application " | MEDICATED_PAD | CUTANEOUS | Status: DC | PRN

## 2021-07-27 MED ORDER — SIMETHICONE 80 MG PO CHEW: 80.0000 mg | CHEWABLE_TABLET | ORAL | Status: DC | PRN

## 2021-07-27 MED ORDER — PHENYLEPHRINE 40 MCG/ML (10ML) SYRINGE FOR IV PUSH (FOR BLOOD PRESSURE SUPPORT): 80.0000 ug | PREFILLED_SYRINGE | INTRAVENOUS | Status: DC | PRN

## 2021-07-27 MED ORDER — OXYCODONE-ACETAMINOPHEN 5-325 MG PO TABS: 1.0000 | ORAL_TABLET | ORAL | Status: DC | PRN

## 2021-07-27 MED ORDER — FENTANYL-BUPIVACAINE-NACL 0.5-0.125-0.9 MG/250ML-% EP SOLN
EPIDURAL | Status: AC
  Filled 2021-07-27: qty 250

## 2021-07-27 MED ORDER — PRENATAL MULTIVITAMIN CH
1.0000 | ORAL_TABLET | Freq: Every day | ORAL | Status: DC
  Administered 2021-07-28: 1 via ORAL
  Filled 2021-07-27: qty 1

## 2021-07-27 MED ORDER — LIDOCAINE HCL (PF) 1 % IJ SOLN: 30.0000 mL | INTRAMUSCULAR | Status: DC | PRN

## 2021-07-27 MED ORDER — ACETAMINOPHEN 325 MG PO TABS: 650.0000 mg | ORAL_TABLET | ORAL | Status: DC | PRN

## 2021-07-27 MED ORDER — ONDANSETRON HCL 4 MG/2ML IJ SOLN: 4.0000 mg | INTRAMUSCULAR | Status: DC | PRN

## 2021-07-27 MED ORDER — TERBUTALINE SULFATE 1 MG/ML IJ SOLN: 0.2500 mg | Freq: Once | INTRAMUSCULAR | Status: DC | PRN

## 2021-07-27 MED ORDER — OXYTOCIN-SODIUM CHLORIDE 30-0.9 UT/500ML-% IV SOLN: 1.0000 m[IU]/min | INTRAVENOUS | Status: DC

## 2021-07-27 MED ORDER — TETANUS-DIPHTH-ACELL PERTUSSIS 5-2.5-18.5 LF-MCG/0.5 IM SUSY: 0.5000 mL | PREFILLED_SYRINGE | Freq: Once | INTRAMUSCULAR | Status: DC

## 2021-07-27 MED ORDER — BUTORPHANOL TARTRATE 1 MG/ML IJ SOLN: 1.0000 mg | INTRAMUSCULAR | Status: DC | PRN

## 2021-07-27 MED ORDER — DIPHENHYDRAMINE HCL 50 MG/ML IJ SOLN: 12.5000 mg | INTRAMUSCULAR | Status: DC | PRN

## 2021-07-27 MED ORDER — LACTATED RINGERS IV SOLN
500.0000 mL | Freq: Once | INTRAVENOUS | Status: DC
Start: 2021-07-27 — End: 2021-07-27

## 2021-07-27 MED ORDER — SENNOSIDES-DOCUSATE SODIUM 8.6-50 MG PO TABS
2.0000 | ORAL_TABLET | Freq: Every day | ORAL | Status: DC
  Administered 2021-07-28: 2 via ORAL
  Filled 2021-07-27: qty 2

## 2021-07-27 MED ORDER — COCONUT OIL OIL: 1.0000 "application " | TOPICAL_OIL | Status: DC | PRN

## 2021-07-27 MED ORDER — BUTORPHANOL TARTRATE 1 MG/ML IJ SOLN
INTRAMUSCULAR | Status: AC
  Administered 2021-07-27: 1 mg via INTRAVENOUS
  Filled 2021-07-27: qty 1

## 2021-07-27 MED ORDER — FENTANYL-BUPIVACAINE-NACL 0.5-0.125-0.9 MG/250ML-% EP SOLN
12.0000 mL/h | EPIDURAL | Status: DC | PRN
  Administered 2021-07-27: 12 mL/h via EPIDURAL

## 2021-07-27 MED ORDER — DIBUCAINE (PERIANAL) 1 % EX OINT: 1.0000 "application " | TOPICAL_OINTMENT | CUTANEOUS | Status: DC | PRN

## 2021-07-27 MED ORDER — FENTANYL CITRATE (PF) 100 MCG/2ML IJ SOLN
INTRAMUSCULAR | Status: DC | PRN
  Administered 2021-07-27: 100 ug via INTRAVENOUS

## 2021-07-27 MED ORDER — IBUPROFEN 600 MG PO TABS
600.0000 mg | ORAL_TABLET | Freq: Four times a day (QID) | ORAL | Status: DC
  Administered 2021-07-27 – 2021-07-28 (×3): 600 mg via ORAL
  Filled 2021-07-27 (×3): qty 1

## 2021-07-27 MED ORDER — OXYTOCIN BOLUS FROM INFUSION: 333.0000 mL | Freq: Once | INTRAVENOUS | Status: DC

## 2021-07-27 MED ORDER — ONDANSETRON HCL 4 MG PO TABS: 4.0000 mg | ORAL_TABLET | ORAL | Status: DC | PRN

## 2021-07-27 MED ORDER — OXYTOCIN-SODIUM CHLORIDE 30-0.9 UT/500ML-% IV SOLN: 2.5000 [IU]/h | INTRAVENOUS | Status: DC

## 2021-07-27 MED ORDER — FLEET ENEMA 7-19 GM/118ML RE ENEM: 1.0000 | ENEMA | RECTAL | Status: DC | PRN

## 2021-07-27 NOTE — Anesthesia Procedure Notes (Signed)
Epidural Patient location during procedure: OB Start time: 07/27/2021 10:32 AM End time: 07/27/2021 10:45 AM  Staffing Anesthesiologist: Brennan Bailey, MD Performed: anesthesiologist   Preanesthetic Checklist Completed: patient identified, IV checked, risks and benefits discussed, monitors and equipment checked, pre-op evaluation and timeout performed  Epidural Patient position: sitting Prep: DuraPrep and site prepped and draped Patient monitoring: continuous pulse ox, blood pressure and heart rate Approach: midline Location: L2-L3 Injection technique: LOR air  Needle:  Needle type: Tuohy  Needle gauge: 17 G Needle length: 9 cm Needle insertion depth: 5 cm Catheter type: closed end flexible Catheter size: 19 Gauge Catheter at skin depth: 10 cm Test dose: negative and Other (1% lidocaine)  Assessment Events: blood not aspirated, injection not painful, no injection resistance, no paresthesia and negative IV test  Additional Notes Patient identified. Risks, benefits, and alternatives discussed with patient including but not limited to bleeding, infection, nerve damage, paralysis, failed block, incomplete pain control, headache, blood pressure changes, nausea, vomiting, reactions to medication, itching, and postpartum back pain. Confirmed with bedside nurse the patient's most recent platelet count. Confirmed with patient that they are not currently taking any anticoagulation, have any bleeding history, or any family history of bleeding disorders. Patient expressed understanding and wished to proceed. All questions were answered. Sterile technique was used throughout the entire procedure. Please see nursing notes for vital signs.   First catheter placed at L3-4 with crisp LOR at 6cm, unable to inject through catheter without significant resistance despite withdrawing 2cm and changing alligator clamp. Catheter removed and second attempt at L2-3 with crisp LOR, catheter threaded and  injects easily. Test dose was given through epidural catheter and negative prior to continuing to dose epidural or start infusion. Warning signs of high block given to the patient including shortness of breath, tingling/numbness in hands, complete motor block, or any concerning symptoms with instructions to call for help. Patient was given instructions on fall risk and not to get out of bed. All questions and concerns addressed with instructions to call with any issues or inadequate analgesia.  Reason for block:procedure for pain

## 2021-07-27 NOTE — H&P (Addendum)
Hannah Walsh is a 31 y.o. female presenting for contractions q54m since 0500. +FM, denies VB, LOF, CTX  GBS neg. PNC otherwise uncomplicated OB History     Gravida  2   Para  1   Term  1   Preterm      AB      Living  1      SAB      IAB      Ectopic      Multiple  0   Live Births  1          Past Medical History:  Diagnosis Date   Abnormal chromosomal and genetic finding on antenatal screening of mother    GERD (gastroesophageal reflux disease)    Hemorrhoids    Malignant melanoma of skin (Hannah Walsh) 02/12/2018   Melanoma (Hannah Walsh)    PONV (postoperative nausea and vomiting)    Single liveborn, born in hospital, delivered by vaginal delivery 01/08/2018   Had a daughter 8lb 3oz   Spinal headache    Varicose vein of leg    Past Surgical History:  Procedure Laterality Date   excision of melanoma     Family History: family history includes Breast cancer in her maternal grandmother; Cervical cancer in her maternal grandmother; Diabetes in her maternal grandmother; Heart disease in her paternal grandfather; Heart disease (age of onset: 87) in her father and maternal grandfather; Hyperlipidemia in her father, maternal grandfather, maternal grandmother, mother, paternal grandfather, and paternal grandmother; Lung cancer in her paternal grandmother. Social History:  reports that she has never smoked. She has never used smokeless tobacco. She reports that she does not drink alcohol and does not use drugs.     Maternal Diabetes: No1hr 114 Genetic Screening: Declined Maternal Ultrasounds/Referrals: Normal Fetal Ultrasounds or other Referrals:  None Maternal Substance Abuse:  No Significant Maternal Medications:  None Significant Maternal Lab Results:  Group B Strep negative Other Comments:  None  Review of Systems  Constitutional:  Negative for chills and fever.  Respiratory:  Negative for shortness of breath.   Cardiovascular:  Negative for chest pain, palpitations and  leg swelling.  Gastrointestinal:  Negative for abdominal pain and vomiting.  Neurological:  Negative for dizziness, weakness and headaches.  Psychiatric/Behavioral:  Negative for suicidal ideas.   Maternal Medical History:  Reason for admission: Contractions.   Contractions: Onset was 6-12 hours ago.   Frequency: regular.   Duration is approximately 5 minutes.   Perceived severity is moderate.   Fetal activity: Perceived fetal activity is normal.   Prenatal Complications - Diabetes: none.  Dilation: 7 Effacement (%): 90 Station: Plus 1 Exam by:: Hannah Walsh, RNC Blood pressure 135/84, pulse 87, temperature 98 F (36.7 C), temperature source Oral, resp. rate 20, height 5\' 1"  (1.549 m), weight 79.7 kg, SpO2 99 %. Exam Physical Exam Constitutional:      General: She is not in acute distress.    Appearance: She is well-developed.  HENT:     Head: Normocephalic and atraumatic.  Eyes:     Pupils: Pupils are equal, round, and reactive to light.  Cardiovascular:     Rate and Rhythm: Normal rate and regular rhythm.     Heart sounds: No murmur heard.   No gallop.  Abdominal:     Tenderness: There is no abdominal tenderness. There is no guarding or rebound.  Genitourinary:    Vagina: Normal.  Musculoskeletal:        General: Normal range of motion.  Cervical back: Normal range of motion and neck supple.  Skin:    General: Skin is warm and dry.  Neurological:     Mental Status: She is alert and oriented to person, place, and time.    Prenatal labs: ABO, Rh: --/--/A POS (01/27 1224) Antibody: NEG (01/27 8250) Rubella: Immune (07/22 0000) RPR: Nonreactive (07/22 0000)  HBsAg: Negative (07/22 0000)  HIV: Non-reactive (07/22 0000)  GBS: Negative/-- (01/06 0000)   Category 1 tracing, TOCO q59m  Assessment/Plan: This is a 31yo G2P1001 @ 26 6/7 by 12wk scan NOT c/w LMP admitted in labor. GBS neg. S/p epidural, starting to feel increased pressure. Pelvis proven to 8lb3oz, vtx.  Anticipate SVD  Of note, patient had clear SROM approx 1.5hrs ago Hannah Walsh 07/27/2021, 30:56 AM

## 2021-07-27 NOTE — Progress Notes (Signed)
Patient now comfortable after re-dosing of epidural. No urge to push at this time.  CE 9/+1, category 1 tracing with early decels. TOCO q32m BP 129/90    Pulse 97    Temp 98 F (36.7 C) (Oral)    Resp 16    Ht 5\' 1"  (1.549 m)    Wt 79.7 kg    SpO2 99%    BMI 33.22 kg/m  Pt to call out with urge to push

## 2021-07-27 NOTE — MAU Note (Signed)
Presents with c/o ctxs every 6-7 minutes apart.  States ctxs began this morning @ 0445.  Denies VB or LOF.  Endorses +FM.

## 2021-07-27 NOTE — Lactation Note (Signed)
This note was copied from a baby's chart. Lactation Consultation Note  Patient Name: Hannah Walsh [id number] Date: 07/27/2021 Reason for consult: Initial assessment;Term Age:30 hours  LC in to room for initial consult. Mother states infant has been latching well, no pain or discomfort.  Reviewed normal newborn behavior during first 24h, expected output and feeding frequency.   Plan: 1-Skin to skin, aim for a deep, comfortable latch and breastfeed on demand or 8-12 times in 24h period. 2-Encouraged maternal rest, hydration and food intake.  3-Contact LC as needed for feeds/support/concerns/questions   All questions answered at this time. Provided Lactation services brochure.    Maternal Data Has patient been taught Hand Expression?: No Does the patient have breastfeeding experience prior to this delivery?: Yes How long did the patient breastfeed?: 10 months  Feeding Mother's Current Feeding Choice: Breast Milk  Interventions Interventions: Breast feeding basics reviewed;Skin to skin;Education;LC Services brochure  Discharge Pump: Personal WIC Program: No  Consult Status Consult Status: Follow-up Date: 07/28/21 Follow-up type: In-patient    Satoshi Kalas A Higuera Ancidey 07/27/2021, 9:21 PM

## 2021-07-27 NOTE — Anesthesia Preprocedure Evaluation (Signed)
Anesthesia Evaluation  Patient identified by MRN, date of birth, ID band Patient awake    Reviewed: Allergy & Precautions, Patient's Chart, lab work & pertinent test results  History of Anesthesia Complications (+) PONV, POST - OP SPINAL HEADACHE and history of anesthetic complications  Airway Mallampati: II  TM Distance: >3 FB Neck ROM: Full    Dental no notable dental hx.    Pulmonary neg pulmonary ROS,    Pulmonary exam normal        Cardiovascular negative cardio ROS Normal cardiovascular exam     Neuro/Psych negative neurological ROS  negative psych ROS   GI/Hepatic Neg liver ROS, GERD  ,  Endo/Other  negative endocrine ROS  Renal/GU negative Renal ROS  negative genitourinary   Musculoskeletal negative musculoskeletal ROS (+)   Abdominal   Peds  Hematology negative hematology ROS (+)   Anesthesia Other Findings Day of surgery medications reviewed with patient.  Reproductive/Obstetrics (+) Pregnancy                             Anesthesia Physical Anesthesia Plan  ASA: 2  Anesthesia Plan: Epidural   Post-op Pain Management:    Induction:   PONV Risk Score and Plan: Treatment may vary due to age or medical condition  Airway Management Planned: Natural Airway  Additional Equipment: Fetal Monitoring  Intra-op Plan:   Post-operative Plan:   Informed Consent: I have reviewed the patients History and Physical, chart, labs and discussed the procedure including the risks, benefits and alternatives for the proposed anesthesia with the patient or authorized representative who has indicated his/her understanding and acceptance.       Plan Discussed with:   Anesthesia Plan Comments:         Anesthesia Quick Evaluation

## 2021-07-28 LAB — CBC
HCT: 30.9 % — ABNORMAL LOW (ref 36.0–46.0)
Hemoglobin: 10.2 g/dL — ABNORMAL LOW (ref 12.0–15.0)
MCH: 31.4 pg (ref 26.0–34.0)
MCHC: 33 g/dL (ref 30.0–36.0)
MCV: 95.1 fL (ref 80.0–100.0)
Platelets: 191 10*3/uL (ref 150–400)
RBC: 3.25 MIL/uL — ABNORMAL LOW (ref 3.87–5.11)
RDW: 14.5 % (ref 11.5–15.5)
WBC: 14.5 10*3/uL — ABNORMAL HIGH (ref 4.0–10.5)
nRBC: 0 % (ref 0.0–0.2)

## 2021-07-28 NOTE — Anesthesia Postprocedure Evaluation (Signed)
Anesthesia Post Note  Patient: Rollene Fare Karow  Procedure(s) Performed: AN AD HOC LABOR EPIDURAL     Patient location during evaluation: Mother Baby Anesthesia Type: Epidural Level of consciousness: awake and alert Pain management: pain level controlled Vital Signs Assessment: post-procedure vital signs reviewed and stable Respiratory status: spontaneous breathing, nonlabored ventilation and respiratory function stable Cardiovascular status: stable Postop Assessment: no headache, no backache and epidural receding Anesthetic complications: no   No notable events documented.  Last Vitals:  Vitals:   07/28/21 0006 07/28/21 0410  BP: 107/70 108/83  Pulse: 98 73  Resp: 18 18  Temp: 36.8 C 36.6 C  SpO2: 100% 100%    Last Pain:  Vitals:   07/28/21 0625  TempSrc:   PainSc: 0-No pain   Pain Goal:                   Rayvon Char

## 2021-07-28 NOTE — Progress Notes (Signed)
Patient is eating, ambulating, voiding.  Pain control is good.  Vitals:   07/27/21 1600 07/27/21 2010 07/28/21 0006 07/28/21 0410  BP: 125/80 110/82 107/70 108/83  Pulse: 95 98 98 73  Resp: 18 18 18 18   Temp: 98.5 F (36.9 C) 98.3 F (36.8 C) 98.2 F (36.8 C) 97.8 F (36.6 C)  TempSrc: Oral Oral Oral Oral  SpO2: 100% 100% 100% 100%  Weight:      Height:        Fundus firm Perineum without swelling.  Lab Results  Component Value Date   WBC 14.5 (H) 07/28/2021   HGB 10.2 (L) 07/28/2021   HCT 30.9 (L) 07/28/2021   MCV 95.1 07/28/2021   PLT 191 07/28/2021    --/--/A POS (01/27 0939)/RI  A/P Post partum day 1.  Routine care.  Expect d/c routine.    Parents desires circumsision.  All risks, benefits and alternatives discussed with the mother.   Daria Pastures

## 2021-07-28 NOTE — Discharge Summary (Signed)
Postpartum Discharge Summary  Date of Service updated      Patient Name: Hannah Walsh DOB: 02-Aug-1990 MRN: [id number]  Date of admission: 07/27/2021 Delivery date:07/27/2021  Delivering provider: Deliah Boston  Date of discharge: 07/28/2021  Admitting diagnosis: Normal labor and delivery [O80] Pregnant and not yet delivered [Z34.90] Intrauterine pregnancy: [redacted]w[redacted]d    Secondary diagnosis:  Principal Problem:   Normal labor and delivery Active Problems:   Pregnant and not yet delivered  Additional problems: none    Discharge diagnosis: Term Pregnancy Delivered                                              Post partum procedures: none Augmentation: N/A Complications: None  Hospital course: Onset of Labor With Vaginal Delivery      31y.o. yo [id number] admitted in Active Labor on 07/27/2021. Patient had an uncomplicated labor course as follows:  Membrane Rupture Time/Date: 9:30 AM ,07/27/2021   Delivery Method:Vaginal, Spontaneous  Episiotomy: None  Lacerations:  Periurethral  Patient had an uncomplicated postpartum course.  She is ambulating, tolerating a regular diet, passing flatus, and urinating well. Patient is discharged home in stable condition on 07/28/21.  Newborn Data: Birth date:07/27/2021  Birth time:1:22 PM  Gender:Female  Living status:Living  Apgars:6 ,9  Weight:4270 g   Magnesium Sulfate received: No BMZ received: No Rhophylac:No MMR:No T-DaP:Given prenatally Flu: No Transfusion:No  Physical exam  Vitals:   07/27/21 1600 07/27/21 2010 07/28/21 0006 07/28/21 0410  BP: 125/80 110/82 107/70 108/83  Pulse: 95 98 98 73  Resp: 18 18 18 18   Temp: 98.5 F (36.9 C) 98.3 F (36.8 C) 98.2 F (36.8 C) 97.8 F (36.6 C)  TempSrc: Oral Oral Oral Oral  SpO2: 100% 100% 100% 100%  Weight:      Height:        Labs: Lab Results  Component Value Date   WBC 14.5 (H) 07/28/2021   HGB 10.2 (L) 07/28/2021   HCT 30.9 (L) 07/28/2021   MCV 95.1  07/28/2021   PLT 191 07/28/2021   CMP Latest Ref Rng & Units 01/07/2020  Glucose 70 - 99 mg/dL 77  BUN 6 - 23 mg/dL 11  Creatinine 0.40 - 1.20 mg/dL 0.66  Sodium 135 - 145 mEq/L 138  Potassium 3.5 - 5.1 mEq/L 4.3  Chloride 96 - 112 mEq/L 103  CO2 19 - 32 mEq/L 28  Calcium 8.4 - 10.5 mg/dL 9.6  Total Protein 6.0 - 8.3 g/dL 7.2  Total Bilirubin 0.2 - 1.2 mg/dL 0.6  Alkaline Phos 39 - 117 U/L 30(L)  AST 0 - 37 U/L 15  ALT 0 - 35 U/L 13   Edinburgh Score: Edinburgh Postnatal Depression Scale Screening Tool 07/27/2021  I have been able to laugh and see the funny side of things. 0  I have looked forward with enjoyment to things. 0  I have blamed myself unnecessarily when things went wrong. 0  I have been anxious or worried for no good reason. 1  I have felt scared or panicky for no good reason. 0  Things have been getting on top of me. 0  I have been so unhappy that I have had difficulty sleeping. 0  I have felt sad or miserable. 1  I have been so unhappy that I have been crying. 0  The thought of harming myself has occurred to me. 0  Edinburgh Postnatal Depression Scale Total 2      After visit meds:  Allergies as of 07/28/2021   No Known Allergies      Medication List     STOP taking these medications    cephALEXin 500 MG capsule Commonly known as: KEFLEX       TAKE these medications    multivitamin-prenatal 27-0.8 MG Tabs tablet Take 1 tablet by mouth daily at 12 noon.         Discharge home in stable condition Infant Feeding:  ? Infant Disposition:home with mother Discharge instruction: per After Visit Summary and Postpartum booklet. Activity: Advance as tolerated. Pelvic rest for 6 weeks.  Diet: routine diet Anticipated Birth Control: Unsure Postpartum Appointment:4 weeks Additional Postpartum F/U:  none Future Appointments:No future appointments. Follow up Visit:  Follow-up Information     [facility], [facility] Follow up in 4 week(s).   Contact  information: [address] [place] [address] [phone]                     07/28/2021 Daria Pastures, MD

## 2021-07-30 ENCOUNTER — Inpatient Hospital Stay (HOSPITAL_COMMUNITY): Payer: 59

## 2021-07-30 ENCOUNTER — Inpatient Hospital Stay (HOSPITAL_COMMUNITY): Admission: AD | Admit: 2021-07-30 | Payer: 59 | Source: Home / Self Care | Admitting: Obstetrics and Gynecology

## 2021-07-30 HISTORY — DX: Gastro-esophageal reflux disease without esophagitis: K21.9

## 2021-08-08 ENCOUNTER — Telehealth (HOSPITAL_COMMUNITY): Payer: Self-pay | Admitting: *Deleted

## 2021-08-08 NOTE — Telephone Encounter (Signed)
Mom reports feeling good. No concerns about herself at this time. EPDS=3(Hospital score=2) Mom reports baby is doing well. Feeding, peeing, and pooping without difficulty. Safe sleep reviewed. Mom reports no concerns about baby at present.  Odis Hollingshead, RN 08-08-2021 at 2:12pm

## 2021-08-13 ENCOUNTER — Telehealth: Payer: 59

## 2021-08-28 NOTE — Telephone Encounter (Signed)
I spoke with pt and she said she has not had fever since Sat and pt is not taking any fever reducing med. Pt said she gargled last night and again this morning with warm salt water and throat feels a lot better.offered pt an appt this morning and pt declined and since feeling better will give it another day to see if continues to improve. Sending note to Rhona Leavens NP and JoEllen CMA.

## 2021-08-28 NOTE — Telephone Encounter (Signed)
Noted and appreciate you calling!

## 2021-08-30 NOTE — Telephone Encounter (Signed)
I spoke with pt for update on condition; pt said the throat is doing a lot better but pt still has a lot of drainage at back of throat that causes an occasional cough but no fever or SOB.pt still wants to wait to see if clears and pt will cb if needs appt. Pt is still doing warm salt water gargles. Sending note to Romilda Garret NP who is in office.Allie Bossier NP is out of office today. ?

## 2021-08-30 NOTE — Telephone Encounter (Signed)
Noted.  Agree with disposition.

## 2021-08-31 ENCOUNTER — Ambulatory Visit: Payer: 59 | Admitting: Family

## 2021-08-31 ENCOUNTER — Other Ambulatory Visit: Payer: Self-pay

## 2021-08-31 VITALS — BP 116/74 | HR 84 | Temp 98.0°F | Ht 61.5 in | Wt 149.0 lb

## 2021-08-31 DIAGNOSIS — H109 Unspecified conjunctivitis: Secondary | ICD-10-CM

## 2021-08-31 DIAGNOSIS — J029 Acute pharyngitis, unspecified: Secondary | ICD-10-CM | POA: Insufficient documentation

## 2021-08-31 DIAGNOSIS — J0110 Acute frontal sinusitis, unspecified: Secondary | ICD-10-CM | POA: Insufficient documentation

## 2021-08-31 HISTORY — DX: Unspecified conjunctivitis: H10.9

## 2021-08-31 HISTORY — DX: Acute frontal sinusitis, unspecified: J01.10

## 2021-08-31 LAB — POCT RAPID STREP A (OFFICE): Rapid Strep A Screen: NEGATIVE

## 2021-08-31 MED ORDER — OFLOXACIN 0.3 % OP SOLN: 1.0000 [drp] | Freq: Four times a day (QID) | OPHTHALMIC | 0 refills | Status: DC

## 2021-08-31 MED ORDER — AMOXICILLIN-POT CLAVULANATE 875-125 MG PO TABS: 1.0000 | ORAL_TABLET | Freq: Two times a day (BID) | ORAL | 0 refills | Status: AC

## 2021-08-31 MED ORDER — ERYTHROMYCIN 5 MG/GM OP OINT: TOPICAL_OINTMENT | OPHTHALMIC | 0 refills | Status: DC

## 2021-08-31 NOTE — Assessment & Plan Note (Signed)
Prescription given for augmentin 875/125 mg po bid for ten days. Pt to continue tylenol/ibuprofen prn sinus pain. Continue with humidifier prn and steam showers recommended as well. instructed If no symptom improvement in 48 hours please f/u ? ?

## 2021-08-31 NOTE — Progress Notes (Signed)
? ?Established Patient Office Visit ? ?Subjective:  ?Patient ID: Hannah Walsh, female    DOB: [birth year]  Age: 31 y.o. MRN: [id number] ? ?CC:  ?Chief Complaint  ?Patient presents with  ? Eye Problem  ?  Pt stated--left eye corner have green mucus, redness, itching, sore throat, coughing, fever 101 --1 week  ? ? ?HPI ?Hannah Walsh is here today with concerns.  ? ?Last week, six days ago, started with flu like symptoms, sore throat, fever up to 101 which has since resolved. Coughing two days ago with green mucous, with post nasal drip. No sob. No chest congestion, sinus headache.  ? ?Has noticed at 4 this morning she had left eye redness and green discharge in the corner of her eye and felt really itchy.  ? ?Pt is breastfeeding.  ? ?Past Medical History:  ?Diagnosis Date  ? Abnormal chromosomal and genetic finding on antenatal screening of mother   ? GERD (gastroesophageal reflux disease)   ? Hemorrhoids   ? Malignant melanoma of skin ([facility]) 02/12/2018  ? Melanoma ([facility])   ? PONV (postoperative nausea and vomiting)   ? Single liveborn, born in hospital, delivered by vaginal delivery 01/08/2018  ? Had a daughter 8lb 3oz  ? Spinal headache   ? Varicose vein of leg   ? ? ?Past Surgical History:  ?Procedure Laterality Date  ? excision of melanoma    ? ? ?Family History  ?Problem Relation Age of Onset  ? Hyperlipidemia Mother   ? Heart disease Father 2  ?     MI  ? Hyperlipidemia Father   ? Heart disease Maternal Grandfather 45  ?     died of MI  ? Hyperlipidemia Maternal Grandfather   ? Breast cancer Maternal Grandmother   ? Hyperlipidemia Maternal Grandmother   ? Diabetes Maternal Grandmother   ? Cervical cancer Maternal Grandmother   ? Hyperlipidemia Paternal Grandmother   ? Lung cancer Paternal Grandmother   ? Heart disease Paternal Grandfather   ? Hyperlipidemia Paternal Grandfather   ? ? ?Social History  ? ?Socioeconomic History  ? Marital status: Married  ?  Spouse name: Not on file  ? Number of children: Not on file   ? Years of education: Not on file  ? Highest education level: Not on file  ?Occupational History  ? Not on file  ?Tobacco Use  ? Smoking status: Never  ? Smokeless tobacco: Never  ?Vaping Use  ? Vaping Use: Never used  ?Substance and Sexual Activity  ? Alcohol use: No  ? Drug use: No  ? Sexual activity: Yes  ?  Partners: Male  ?Other Topics Concern  ? Not on file  ?Social History Narrative  ? Married.  ? 1 child.  ? 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  ? ?Social Determinants of Health  ? ?Financial Resource Strain: Not on file  ?Food Insecurity: Not on file  ?Transportation Needs: Not on file  ?Physical Activity: Not on file  ?Stress: Not on file  ?Social Connections: Not on file  ?Intimate Partner Violence: Not on file  ? ? ?Outpatient Medications Prior to Visit  ?Medication Sig Dispense Refill  ? norethindrone (MICRONOR) 0.35 MG tablet Take 1 tablet by mouth daily.    ? Prenatal Vit-Fe Fumarate-FA (MULTIVITAMIN-PRENATAL) 27-0.8 MG TABS tablet Take 1 tablet by mouth daily at 12 noon.    ? ?No facility-administered medications prior to visit.  ? ? ?No Known Allergies ? ?ROS ?Review of  Systems  ?Constitutional:  Positive for fever (resolved). Negative for chills.  ?HENT:  Positive for postnasal drip, sinus pressure and sore throat. Negative for congestion and ear pain.   ?Eyes:  Positive for discharge (green left eye) and itching. Negative for pain and visual disturbance.  ?Respiratory:  Positive for cough (dry). Negative for shortness of breath and wheezing.   ?Cardiovascular:  Negative for chest pain and palpitations.  ? ?  ?Objective:  ?  ?Physical Exam ?Constitutional:   ?   General: She is not in acute distress. ?   Appearance: Normal appearance. She is normal weight. She is not ill-appearing, toxic-appearing or diaphoretic.  ?HENT:  ?   Head: Normocephalic.  ?   Right Ear: Tympanic membrane normal.  ?   Left Ear: Tympanic membrane normal.  ?   Nose: Nose normal.  ?   Mouth/Throat:  ?    Mouth: Mucous membranes are moist.  ?Eyes:  ?   General: Scleral icterus present.     ?   Left eye: Discharge (green) present. ?   Pupils: Pupils are equal, round, and reactive to light.  ?Cardiovascular:  ?   Rate and Rhythm: Normal rate and regular rhythm.  ?Pulmonary:  ?   Effort: Pulmonary effort is normal.  ?Neurological:  ?   Mental Status: She is alert.  ? ? ?BP 116/74   Pulse 84   Temp 98 ?F (36.7 ?C)   Ht 5' 1.5" (1.562 m)   Wt 149 lb (67.6 kg)   SpO2 98%   BMI 27.70 kg/m?  ?Wt Readings from Last 3 Encounters:  ?08/31/21 149 lb (67.6 kg)  ?07/27/21 175 lb 12.8 oz (79.7 kg)  ?04/13/21 154 lb (69.9 kg)  ? ? ? ?Health Maintenance Due  ?Topic Date Due  ? COVID-19 Vaccine (5 - Booster) 12/01/2019  ? ? ?There are no preventive care reminders to display for this patient. ? ?Lab Results  ?Component Value Date  ? TSH 1.03 04/26/2016  ? ?Lab Results  ?Component Value Date  ? WBC 14.5 (H) 07/28/2021  ? HGB 10.2 (L) 07/28/2021  ? HCT 30.9 (L) 07/28/2021  ? MCV 95.1 07/28/2021  ? PLT 191 07/28/2021  ? ?Lab Results  ?Component Value Date  ? NA 138 01/07/2020  ? K 4.3 01/07/2020  ? CO2 28 01/07/2020  ? GLUCOSE 77 01/07/2020  ? BUN 11 01/07/2020  ? CREATININE 0.66 01/07/2020  ? BILITOT 0.6 01/07/2020  ? ALKPHOS 30 (L) 01/07/2020  ? AST 15 01/07/2020  ? ALT 13 01/07/2020  ? PROT 7.2 01/07/2020  ? ALBUMIN 4.5 01/07/2020  ? CALCIUM 9.6 01/07/2020  ? GFR 106.59 01/07/2020  ? ?No results found for: HGBA1C ? ?  ?Assessment & Plan:  ? ?Problem List Items Addressed This Visit   ? ?  ? Respiratory  ? Acute non-recurrent frontal sinusitis  ?  Prescription given for augmentin 875/125 mg po bid for ten days. Pt to continue tylenol/ibuprofen prn sinus pain. Continue with humidifier prn and steam showers recommended as well. instructed If no symptom improvement in 48 hours please f/u ? ?  ?  ? Relevant Medications  ? amoxicillin-clavulanate (AUGMENTIN) 875-125 MG tablet  ?  ? Other  ? Bacterial conjunctivitis of left eye  ?  rx  for ofloxacin sent to pt pharmacy, take as prescribed.  ?Discussed how to wash eye appropriately with warm warm cloth from inner to outer canthus. Wash bed sheets and pillow cases to prevent re-infection. Frequent handwashing. If no  improvement in 24-48 hours with eye drops as prescribed, please call office.  ? ?  ?  ? Relevant Medications  ? erythromycin ophthalmic ointment  ? Sore throat - Primary  ?  Strep test in office today ?  ?  ? Relevant Orders  ? POCT rapid strep A (Completed)  ? ? ?Meds ordered this encounter  ?Medications  ? amoxicillin-clavulanate (AUGMENTIN) 875-125 MG tablet  ?  Sig: Take 1 tablet by mouth 2 (two) times daily for 7 days.  ?  Dispense:  14 tablet  ?  Refill:  0  ?  Order Specific Question:   Supervising Provider  ?  Answer:   BEDSOLE, AMY E [[id number]]  ? DISCONTD: ofloxacin (OCUFLOX) 0.3 % ophthalmic solution  ?  Sig: Place 1 drop into the left eye 4 (four) times daily for 7 days.  ?  Dispense:  1.4 mL  ?  Refill:  0  ?  Order Specific Question:   Supervising Provider  ?  Answer:   BEDSOLE, AMY E [[id number]]  ? erythromycin ophthalmic ointment  ?  Sig: Instill ~1 cm ribbon into affected eye(s) 4 times daily for 7 days  ?  Dispense:  3.5 g  ?  Refill:  0  ?  Order Specific Question:   Supervising Provider  ?  Answer:   BEDSOLE, AMY E [[id number]]  ? ? ?Follow-up: Return if symptoms worsen or fail to improve.  ? ? ?Eugenia Pancoast, FNP ?

## 2021-08-31 NOTE — Assessment & Plan Note (Signed)
Strep test in office today ?

## 2021-08-31 NOTE — Patient Instructions (Addendum)
Start prescription of augmentin 875/125 mg and take as prescribed.  ?Tylenol/ibuprofen ok for sinus pain as needed, also ok with breastfeeding.  ?Increase oral fluids. Ok to continue with humidifers and hot steamy showers as discussed during visit. ? ?Eye drops were prescribed today and sent to your preferred pharmacy.  ?Start eye drop, and use as instructed.  ?When washing eye, wash from inner to outer canthus with warm wash cloth.  ?Wash bed sheets and pillow cases.  ?Throw out all old makeup, and try not to use makeup while duration of eye drops prescription.  ? ?I recommend you start taking flonase nose spray daily as well.  ? ?It was a pleasure speaking with you today, I hope you start feeling better soon. ? ?Regards,  ? ?Louella Medaglia ? ?

## 2021-08-31 NOTE — Assessment & Plan Note (Signed)
rx for ofloxacin sent to pt pharmacy, take as prescribed.  Discussed how to wash eye appropriately with warm warm cloth from inner to outer canthus. Wash bed sheets and pillow cases to prevent re-infection. Frequent handwashing. If no improvement in 24-48 hours with eye drops as prescribed, please call office.   

## 2021-09-27 ENCOUNTER — Ambulatory Visit: Payer: 59 | Admitting: Nurse Practitioner

## 2021-09-27 ENCOUNTER — Encounter: Payer: Self-pay | Admitting: Nurse Practitioner

## 2021-09-27 ENCOUNTER — Telehealth: Payer: Self-pay | Admitting: Primary Care

## 2021-09-27 VITALS — BP 120/88 | HR 84 | Temp 97.0°F | Resp 10 | Ht 61.5 in | Wt 148.4 lb

## 2021-09-27 DIAGNOSIS — R0982 Postnasal drip: Secondary | ICD-10-CM | POA: Insufficient documentation

## 2021-09-27 DIAGNOSIS — J020 Streptococcal pharyngitis: Secondary | ICD-10-CM

## 2021-09-27 DIAGNOSIS — J029 Acute pharyngitis, unspecified: Secondary | ICD-10-CM

## 2021-09-27 HISTORY — DX: Streptococcal pharyngitis: J02.0

## 2021-09-27 LAB — POCT RAPID STREP A (OFFICE): Rapid Strep A Screen: POSITIVE — AB

## 2021-09-27 MED ORDER — FLUTICASONE PROPIONATE 50 MCG/ACT NA SUSP: 2.0000 | Freq: Every day | NASAL | 0 refills | Status: DC

## 2021-09-27 MED ORDER — AMOXICILLIN 500 MG PO CAPS: 500.0000 mg | ORAL_CAPSULE | Freq: Two times a day (BID) | ORAL | 0 refills | Status: AC

## 2021-09-27 NOTE — Assessment & Plan Note (Signed)
We will start fluticasone 2 sprays each nostril once daily.  Did review fluticasone precautions in regards to inciting epistaxis. ?

## 2021-09-27 NOTE — Telephone Encounter (Signed)
Noted will see her in office ?

## 2021-09-27 NOTE — Progress Notes (Signed)
? ?Acute Office Visit ? ?Subjective:  ? ? Patient ID: Hannah Walsh, female    DOB: 01-01-1991, 31 y.o.   MRN: [id number] ? ?Chief Complaint  ?Patient presents with  ? Sore Throat  ?  Sx started on 09/24/21, post nasal drip. No fever. Has been exposed to 2 people with strep  ? ? ? ?Patient is in today for sore throat ? ?Symptoms started on 09/24/2021 ?States that she went to the baby shower on saturday where several people had strep.  ?Her husband and sister in law tested positive for strep ?Covid vaccine x2 and 2 boosters ?Flu vaccine up to date ?No OTC treatment so far ?Patient does have a 20 week old infant and is currently breastfeeding ?Past Medical History:  ?Diagnosis Date  ? Abnormal chromosomal and genetic finding on antenatal screening of mother   ? GERD (gastroesophageal reflux disease)   ? Hemorrhoids   ? Malignant melanoma of skin ([place]) 02/12/2018  ? Melanoma ([place])   ? PONV (postoperative nausea and vomiting)   ? Single liveborn, born in hospital, delivered by vaginal delivery 01/08/2018  ? Had a daughter 8lb 3oz  ? Spinal headache   ? Varicose vein of leg   ? ? ?Past Surgical History:  ?Procedure Laterality Date  ? excision of melanoma    ? ? ?Family History  ?Problem Relation Age of Onset  ? Hyperlipidemia Mother   ? Heart disease Father 14  ?     MI  ? Hyperlipidemia Father   ? Heart disease Maternal Grandfather 16  ?     died of MI  ? Hyperlipidemia Maternal Grandfather   ? Breast cancer Maternal Grandmother   ? Hyperlipidemia Maternal Grandmother   ? Diabetes Maternal Grandmother   ? Cervical cancer Maternal Grandmother   ? Hyperlipidemia Paternal Grandmother   ? Lung cancer Paternal Grandmother   ? Heart disease Paternal Grandfather   ? Hyperlipidemia Paternal Grandfather   ? ? ?Social History  ? ?Socioeconomic History  ? Marital status: Married  ?  Spouse name: Not on file  ? Number of children: Not on file  ? Years of education: Not on file  ? Highest education level: Not on file  ?Occupational  History  ? Not on file  ?Tobacco Use  ? Smoking status: Never  ? Smokeless tobacco: Never  ?Vaping Use  ? Vaping Use: Never used  ?Substance and Sexual Activity  ? Alcohol use: No  ? Drug use: No  ? Sexual activity: Yes  ?  Partners: Male  ?Other Topics Concern  ? Not on file  ?Social History Narrative  ? Married.  ? 1 child.  ? 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  ? ?Social Determinants of Health  ? ?Financial Resource Strain: Not on file  ?Food Insecurity: Not on file  ?Transportation Needs: Not on file  ?Physical Activity: Not on file  ?Stress: Not on file  ?Social Connections: Not on file  ?Intimate Partner Violence: Not on file  ? ? ?Outpatient Medications Prior to Visit  ?Medication Sig Dispense Refill  ? norethindrone (MICRONOR) 0.35 MG tablet Take 1 tablet by mouth daily.    ? Prenatal Vit-Fe Fumarate-FA (MULTIVITAMIN-PRENATAL) 27-0.8 MG TABS tablet Take 1 tablet by mouth daily at 12 noon.    ? erythromycin ophthalmic ointment Instill ~1 cm ribbon into affected eye(s) 4 times daily for 7 days 3.5 g 0  ? ?No facility-administered medications prior to visit.  ? ? ?No  Known Allergies ? ?Review of Systems  ?Constitutional:  Negative for appetite change, chills, fatigue and fever.  ?HENT:  Positive for postnasal drip and sore throat. Negative for congestion, ear discharge, ear pain, sinus pressure and sinus pain.   ?Respiratory:  Positive for cough (non productive). Negative for shortness of breath.   ?Cardiovascular:  Negative for chest pain.  ?Gastrointestinal:  Negative for diarrhea, nausea and vomiting.  ?Musculoskeletal:  Negative for arthralgias and myalgias.  ?Neurological:  Positive for headaches.  ? ?   ?Objective:  ?  ?Physical Exam ?Vitals and nursing note reviewed.  ?Constitutional:   ?   Appearance: Normal appearance.  ?HENT:  ?   Right Ear: Tympanic membrane, ear canal and external ear normal.  ?   Left Ear: Tympanic membrane, ear canal and external ear normal.  ?    Mouth/Throat:  ?   Mouth: Mucous membranes are moist.  ?   Pharynx: Oropharynx is clear. Posterior oropharyngeal erythema present.  ?Cardiovascular:  ?   Rate and Rhythm: Normal rate and regular rhythm.  ?Pulmonary:  ?   Effort: Pulmonary effort is normal.  ?   Breath sounds: Normal breath sounds.  ?Abdominal:  ?   General: Bowel sounds are normal.  ?Lymphadenopathy:  ?   Cervical: Cervical adenopathy present.  ?Neurological:  ?   Mental Status: She is alert.  ? ? ?BP 120/88   Pulse 84   Temp (!) 97 ?F (36.1 ?C)   Resp 10   Ht 5' 1.5" (1.562 m)   Wt 148 lb 6 oz (67.3 kg)   LMP 09/13/2021   SpO2 100%   Breastfeeding Yes   BMI 27.58 kg/m?  ?Wt Readings from Last 3 Encounters:  ?09/27/21 148 lb 6 oz (67.3 kg)  ?08/31/21 149 lb (67.6 kg)  ?07/27/21 175 lb 12.8 oz (79.7 kg)  ? ? ?Health Maintenance Due  ?Topic Date Due  ? COVID-19 Vaccine (5 - Booster) 12/01/2019  ? ? ?There are no preventive care reminders to display for this patient. ? ? ?Lab Results  ?Component Value Date  ? TSH 1.03 04/26/2016  ? ?Lab Results  ?Component Value Date  ? WBC 14.5 (H) 07/28/2021  ? HGB 10.2 (L) 07/28/2021  ? HCT 30.9 (L) 07/28/2021  ? MCV 95.1 07/28/2021  ? PLT 191 07/28/2021  ? ?Lab Results  ?Component Value Date  ? NA 138 01/07/2020  ? K 4.3 01/07/2020  ? CO2 28 01/07/2020  ? GLUCOSE 77 01/07/2020  ? BUN 11 01/07/2020  ? CREATININE 0.66 01/07/2020  ? BILITOT 0.6 01/07/2020  ? ALKPHOS 30 (L) 01/07/2020  ? AST 15 01/07/2020  ? ALT 13 01/07/2020  ? PROT 7.2 01/07/2020  ? ALBUMIN 4.5 01/07/2020  ? CALCIUM 9.6 01/07/2020  ? GFR 106.59 01/07/2020  ? ?Lab Results  ?Component Value Date  ? CHOL 159 01/07/2020  ? ?Lab Results  ?Component Value Date  ? HDL 59.10 01/07/2020  ? ?Lab Results  ?Component Value Date  ? [place] 86 01/07/2020  ? ?Lab Results  ?Component Value Date  ? TRIG 68.0 01/07/2020  ? ?Lab Results  ?Component Value Date  ? CHOLHDL 3 01/07/2020  ? ?No results found for: HGBA1C ? ?   ?Assessment & Plan:  ? ?Problem List  Items Addressed This Visit   ? ?  ? Respiratory  ? Strep pharyngitis  ?  Prep test positive in office.  Treat with amoxicillin 500 mg twice daily for 10 days.  Follow-up if no improvement or  symptoms worsen. ?  ?  ? Relevant Medications  ? amoxicillin (AMOXIL) 500 MG capsule  ?  ? Other  ? Sore throat - Primary  ?  Patient was exposed to several folks that were positive for strep pharyngitis.  Patient strep test positive in office. ?  ?  ? Relevant Orders  ? Rapid Strep A  ? PND (post-nasal drip)  ?  We will start fluticasone 2 sprays each nostril once daily.  Did review fluticasone precautions in regards to inciting epistaxis. ?  ?  ? Relevant Medications  ? fluticasone (FLONASE) 50 MCG/ACT nasal spray  ? ? ? ?Meds ordered this encounter  ?Medications  ? amoxicillin (AMOXIL) 500 MG capsule  ?  Sig: Take 1 capsule (500 mg total) by mouth 2 (two) times daily for 10 days.  ?  Dispense:  20 capsule  ?  Refill:  0  ?  Order Specific Question:   Supervising Provider  ?  Answer:   Loura Pardon A [[id number]]  ? fluticasone (FLONASE) 50 MCG/ACT nasal spray  ?  Sig: Place 2 sprays into both nostrils daily.  ?  Dispense:  16 g  ?  Refill:  0  ?  Order Specific Question:   Supervising Provider  ?  Answer:   Loura Pardon A [[id number]]  ? ? ? ?Romilda Garret, NP ? ?

## 2021-09-27 NOTE — Assessment & Plan Note (Signed)
Patient was exposed to several folks that were positive for strep pharyngitis.  Patient strep test positive in office. ?

## 2021-09-27 NOTE — Telephone Encounter (Signed)
Called patient she has had 2 pos strep of people she has been around in last few day. Started having sore throat recently. I have scheduled to see Hannah Walsh today in office.  ?

## 2021-09-27 NOTE — Patient Instructions (Signed)
Nice to see you today ?I sent in 2 medications to your pharmacy ?Follow up if symptoms do not improve or worsen ?

## 2021-09-27 NOTE — Assessment & Plan Note (Signed)
Prep test positive in office.  Treat with amoxicillin 500 mg twice daily for 10 days.  Follow-up if no improvement or symptoms worsen. ?

## 2021-09-27 NOTE — Telephone Encounter (Signed)
Pt called asking for an order for a stripe test. Please advise. ?

## 2021-10-06 ENCOUNTER — Encounter: Payer: Self-pay | Admitting: Nurse Practitioner

## 2021-10-08 NOTE — Telephone Encounter (Signed)
Spoke to pt. She said she is feeling much better. Was only ill for about 12 hrs. Does not need an OV. ?

## 2021-10-19 ENCOUNTER — Other Ambulatory Visit: Payer: Self-pay | Admitting: Nurse Practitioner

## 2021-10-19 DIAGNOSIS — R0982 Postnasal drip: Secondary | ICD-10-CM

## 2022-05-31 ENCOUNTER — Ambulatory Visit: Payer: 59 | Admitting: Primary Care

## 2022-05-31 ENCOUNTER — Encounter: Payer: Self-pay | Admitting: Primary Care

## 2022-05-31 VITALS — BP 118/80 | HR 85 | Temp 97.5°F | Ht 61.5 in | Wt 133.6 lb

## 2022-05-31 DIAGNOSIS — R197 Diarrhea, unspecified: Secondary | ICD-10-CM

## 2022-05-31 DIAGNOSIS — Z23 Encounter for immunization: Secondary | ICD-10-CM | POA: Diagnosis not present

## 2022-05-31 DIAGNOSIS — M2550 Pain in unspecified joint: Secondary | ICD-10-CM | POA: Diagnosis not present

## 2022-05-31 DIAGNOSIS — Z0001 Encounter for general adult medical examination with abnormal findings: Secondary | ICD-10-CM | POA: Diagnosis not present

## 2022-05-31 HISTORY — DX: Diarrhea, unspecified: R19.7

## 2022-05-31 LAB — CBC WITH DIFFERENTIAL/PLATELET
Basophils Absolute: 0.1 10*3/uL (ref 0.0–0.1)
Basophils Relative: 1.2 % (ref 0.0–3.0)
Eosinophils Absolute: 0.1 10*3/uL (ref 0.0–0.7)
Eosinophils Relative: 2.3 % (ref 0.0–5.0)
HCT: 41.1 % (ref 36.0–46.0)
Hemoglobin: 13.9 g/dL (ref 12.0–15.0)
Lymphocytes Relative: 31.6 % (ref 12.0–46.0)
Lymphs Abs: 1.5 10*3/uL (ref 0.7–4.0)
MCHC: 33.7 g/dL (ref 30.0–36.0)
MCV: 90.3 fl (ref 78.0–100.0)
Monocytes Absolute: 0.6 10*3/uL (ref 0.1–1.0)
Monocytes Relative: 13.1 % — ABNORMAL HIGH (ref 3.0–12.0)
Neutro Abs: 2.4 10*3/uL (ref 1.4–7.7)
Neutrophils Relative %: 51.8 % (ref 43.0–77.0)
Platelets: 242 10*3/uL (ref 150.0–400.0)
RBC: 4.55 Mil/uL (ref 3.87–5.11)
RDW: 13 % (ref 11.5–15.5)
WBC: 4.7 10*3/uL (ref 4.0–10.5)

## 2022-05-31 LAB — COMPREHENSIVE METABOLIC PANEL
ALT: 13 U/L (ref 0–35)
AST: 14 U/L (ref 0–37)
Albumin: 4.8 g/dL (ref 3.5–5.2)
Alkaline Phosphatase: 61 U/L (ref 39–117)
BUN: 11 mg/dL (ref 6–23)
CO2: 29 mEq/L (ref 19–32)
Calcium: 9.7 mg/dL (ref 8.4–10.5)
Chloride: 103 mEq/L (ref 96–112)
Creatinine, Ser: 0.66 mg/dL (ref 0.40–1.20)
GFR: 117.68 mL/min (ref >60.00)
Glucose, Bld: 81 mg/dL (ref 70–99)
Potassium: 4.2 mEq/L (ref 3.5–5.1)
Sodium: 139 mEq/L (ref 135–145)
Total Bilirubin: 0.6 mg/dL (ref 0.2–1.2)
Total Protein: 7.4 g/dL (ref 6.0–8.3)

## 2022-05-31 LAB — LIPID PANEL
Cholesterol: 135 mg/dL (ref 0–200)
HDL: 50.9 mg/dL (ref >39.00)
LDL Cholesterol: 71 mg/dL (ref 0–99)
NonHDL: 84.34
Total CHOL/HDL Ratio: 3
Triglycerides: 66 mg/dL (ref 0.0–149.0)
VLDL: 13.2 mg/dL (ref 0.0–40.0)

## 2022-05-31 LAB — SEDIMENTATION RATE: Sed Rate: 21 mm/hr — ABNORMAL HIGH (ref 0–20)

## 2022-05-31 LAB — URIC ACID: Uric Acid, Serum: 4.3 mg/dL (ref 2.4–7.0)

## 2022-05-31 LAB — C-REACTIVE PROTEIN: CRP: <1 mg/dL (ref 0.5–20.0)

## 2022-05-31 NOTE — Assessment & Plan Note (Signed)
Unclear etiology, could be secondary to her dietary changes, could also be viral.  Checking CBC with diff. Offered to check stool studies, she will update early next week. If symptoms persist then she will complete testing.

## 2022-05-31 NOTE — Assessment & Plan Note (Signed)
Generalized, mild disfigurement noted to a few PIP joints hands.  Checking labs today to evaluate for RA including RF, CCP, Sed rate, CRP. Also checking uric acid.  Await results.

## 2022-05-31 NOTE — Progress Notes (Signed)
Subjective:    Patient ID: Hannah Walsh, female    DOB: [birth year], 31 y.o.   MRN: [id number]  Diarrhea  Associated symptoms include arthralgias. Pertinent negatives include no coughing, headaches or myalgias.    Hannah Walsh is a very pleasant 31 y.o. female who presents today for complete physical and follow up of chronic conditions.  She would also like to discuss diarrhea and abdominal soreness. Also with nausea, no vomiting. Acute over the last 24-36 hours, experiencing 5-6 episodes of diarrhea daily. Occurs with and without eating or drinking. No one else has these symptoms in her household. She has been eating dairy free for the last 1 month, is eating more plant based butter and other dairy substitute products. She works as a Copywriter, advertising.   She's also noticed generalized joint aches, this began over the last few months. Joint aches are located to the ankles, hips, wrists, knees, hands. Chronic joint aches to her hands and knees. She's noticed slight disfigurement to the joints of her hands. She has a family history of osteoarthritis.   Immunizations: -Tetanus: 2022 -Influenza: Due today  Diet: Fair diet.  Exercise: No regular exercise.  Eye exam: Completes annually  Dental exam: Completes semi-annually   Pap Smear: Completed in July 2022   BP Readings from Last 3 Encounters:  05/31/22 118/80  09/27/21 120/88  08/31/21 116/74       Review of Systems  Constitutional:  Negative for unexpected weight change.  HENT:  Negative for rhinorrhea.   Eyes:  Negative for visual disturbance.  Respiratory:  Negative for cough and shortness of breath.   Cardiovascular:  Negative for chest pain.  Gastrointestinal:  Positive for diarrhea and nausea. Negative for constipation.  Genitourinary:  Negative for difficulty urinating and menstrual problem.  Musculoskeletal:  Positive for arthralgias. Negative for joint swelling and myalgias.  Skin:  Negative for rash.   Allergic/Immunologic: Negative for environmental allergies.  Neurological:  Negative for dizziness, numbness and headaches.  Psychiatric/Behavioral:  The patient is not nervous/anxious.          Past Medical History:  Diagnosis Date   Abnormal chromosomal and genetic finding on antenatal screening of mother    Acute non-recurrent frontal sinusitis 08/31/2021   Bacterial conjunctivitis of left eye 08/31/2021   Cellulitis of face 04/13/2021   GERD (gastroesophageal reflux disease)    Hemorrhoids    Malignant melanoma of skin ([place]) 02/12/2018   Melanoma ([place])    PONV (postoperative nausea and vomiting)    Sensation of foreign body in throat 10/30/2018   Single liveborn, born in hospital, delivered by vaginal delivery 01/08/2018   Had a daughter 8lb 3oz   Spinal headache    Strep pharyngitis 09/27/2021   Varicose vein of leg     Social History   Socioeconomic History   Marital status: Married    Spouse name: Not on file   Number of children: Not on file   Years of education: Not on file   Highest education level: Not on file  Occupational History   Not on file  Tobacco Use   Smoking status: Never   Smokeless tobacco: Never  Vaping Use   Vaping Use: Never used  Substance and Sexual Activity   Alcohol use: No   Drug use: No   Sexual activity: Yes    Partners: Male  Other Topics Concern   Not on file  Social History Narrative   Married.   1 child.   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   Social Determinants of Health   Financial Resource Strain: Not on file  Food Insecurity: Not on file  Transportation Needs: Not on file  Physical Activity: Not on file  Stress: Not on file  Social Connections: Not on file  Intimate Partner Violence: Not on file    Past Surgical History:  Procedure Laterality Date   excision of melanoma      Family History  Problem Relation Age of Onset   Hyperlipidemia Mother    Heart disease Father 1        MI   Hyperlipidemia Father    Heart disease Maternal Grandfather 54       died of MI   Hyperlipidemia Maternal Grandfather    Breast cancer Maternal Grandmother    Hyperlipidemia Maternal Grandmother    Diabetes Maternal Grandmother    Cervical cancer Maternal Grandmother    Hyperlipidemia Paternal Grandmother    Lung cancer Paternal Grandmother    Heart disease Paternal Grandfather    Hyperlipidemia Paternal Grandfather     No Known Allergies  Current Outpatient Medications on File Prior to Visit  Medication Sig Dispense Refill   Prenatal Vit-Fe Fumarate-FA (MULTIVITAMIN-PRENATAL) 27-0.8 MG TABS tablet Take 1 tablet by mouth daily at 12 noon.     No current facility-administered medications on file prior to visit.    BP 118/80   Pulse 85   Temp (!) 97.5 F (36.4 C) (Oral)   Ht 5' 1.5" (1.562 m)   Wt 133 lb 9.6 oz (60.6 kg)   SpO2 99%   BMI 24.83 kg/m  Objective:   Physical Exam HENT:     Right Ear: Tympanic membrane and ear canal normal.     Left Ear: Tympanic membrane and ear canal normal.     Nose: Nose normal.  Eyes:     Conjunctiva/sclera: Conjunctivae normal.     Pupils: Pupils are equal, round, and reactive to light.  Neck:     Thyroid: No thyromegaly.  Cardiovascular:     Rate and Rhythm: Normal rate and regular rhythm.     Heart sounds: No murmur heard. Pulmonary:     Effort: Pulmonary effort is normal.     Breath sounds: Normal breath sounds. No rales.  Abdominal:     General: Bowel sounds are normal.     Palpations: Abdomen is soft.     Tenderness: There is no abdominal tenderness.  Musculoskeletal:        General: Normal range of motion.     Cervical back: Neck supple.     Comments: Mild disfigurement to PIP joints of the hands  Lymphadenopathy:     Cervical: No cervical adenopathy.  Skin:    General: Skin is warm and dry.     Findings: No rash.  Neurological:     Mental Status: She is alert and oriented to person, place, and time.      Cranial Nerves: No cranial nerve deficit.     Deep Tendon Reflexes: Reflexes are normal and symmetric.  Psychiatric:        Mood and Affect: Mood normal.           Assessment & Plan:   Problem List Items Addressed This Visit       Digestive   Acute diarrhea    Unclear etiology, could be secondary to her dietary changes, could also be viral.  Checking CBC with diff. Offered to check stool studies, she will update early next  week. If symptoms persist then she will complete testing.      Relevant Orders   CBC with Differential/Platelet   Comprehensive metabolic panel     Other   Encounter for annual general medical examination with abnormal findings in adult - Primary    Immunizations UTD. Influenza vaccine provided today. Pap smear UTD, follows with GYN  Discussed the importance of a healthy diet and regular exercise in order for weight loss, and to reduce the risk of further co-morbidity.  Exam stable. Labs pending.  Follow up in 1 year for repeat physical.       Relevant Orders   Lipid panel   Joint pain    Generalized, mild disfigurement noted to a few PIP joints hands.  Checking labs today to evaluate for RA including RF, CCP, Sed rate, CRP. Also checking uric acid.  Await results.       Relevant Orders   Uric acid   C-reactive protein   Cyclic citrul peptide antibody, IgG   Sedimentation rate   Rheumatoid factor       Pleas Koch, NP

## 2022-05-31 NOTE — Patient Instructions (Signed)
Stop by the lab prior to leaving today. I will notify you of your results once received.   It was a pleasure to see you today!  Preventive Care 21-30 Years Old, Female Preventive care refers to lifestyle choices and visits with your health care provider that can promote health and wellness. Preventive care visits are also called wellness exams. What can I expect for my preventive care visit? Counseling During your preventive care visit, your health care provider may ask about your: Medical history, including: Past medical problems. Family medical history. Pregnancy history. Current health, including: Menstrual cycle. Method of birth control. Emotional well-being. Home life and relationship well-being. Sexual activity and sexual health. Lifestyle, including: Alcohol, nicotine or tobacco, and drug use. Access to firearms. Diet, exercise, and sleep habits. Work and work environment. Sunscreen use. Safety issues such as seatbelt and bike helmet use. Physical exam Your health care provider may check your: Height and weight. These may be used to calculate your BMI (body mass index). BMI is a measurement that tells if you are at a healthy weight. Waist circumference. This measures the distance around your waistline. This measurement also tells if you are at a healthy weight and may help predict your risk of certain diseases, such as type 2 diabetes and high blood pressure. Heart rate and blood pressure. Body temperature. Skin for abnormal spots. What immunizations do I need?  Vaccines are usually given at various ages, according to a schedule. Your health care provider will recommend vaccines for you based on your age, medical history, and lifestyle or other factors, such as travel or where you work. What tests do I need? Screening Your health care provider may recommend screening tests for certain conditions. This may include: Pelvic exam and Pap test. Lipid and cholesterol  levels. Diabetes screening. This is done by checking your blood sugar (glucose) after you have not eaten for a while (fasting). Hepatitis B test. Hepatitis C test. HIV (human immunodeficiency virus) test. STI (sexually transmitted infection) testing, if you are at risk. BRCA-related cancer screening. This may be done if you have a family history of breast, ovarian, tubal, or peritoneal cancers. Talk with your health care provider about your test results, treatment options, and if necessary, the need for more tests. Follow these instructions at home: Eating and drinking  Eat a healthy diet that includes fresh fruits and vegetables, whole grains, lean protein, and low-fat dairy products. Take vitamin and mineral supplements as recommended by your health care provider. Do not drink alcohol if: Your health care provider tells you not to drink. You are pregnant, may be pregnant, or are planning to become pregnant. If you drink alcohol: Limit how much you have to 0-1 drink a day. Know how much alcohol is in your drink. In the [place] one drink equals one 12 oz bottle of beer (355 mL), one 5 oz glass of wine (148 mL), or one 1 oz glass of hard liquor (44 mL). Lifestyle Brush your teeth every morning and night with fluoride toothpaste. Floss one time each day. Exercise for at least 30 minutes 5 or more days each week. Do not use any products that contain nicotine or tobacco. These products include cigarettes, chewing tobacco, and vaping devices, such as e-cigarettes. If you need help quitting, ask your health care provider. Do not use drugs. If you are sexually active, practice safe sex. Use a condom or other form of protection to prevent STIs. If you do not wish to become pregnant, use a   form of birth control. If you plan to become pregnant, see your health care provider for a prepregnancy visit. Find healthy ways to manage stress, such as: Meditation, yoga, or listening to  music. Journaling. Talking to a trusted person. Spending time with friends and family. Minimize exposure to UV radiation to reduce your risk of skin cancer. Safety Always wear your seat belt while driving or riding in a vehicle. Do not drive: If you have been drinking alcohol. Do not ride with someone who has been drinking. If you have been using any mind-altering substances or drugs. While texting. When you are tired or distracted. Wear a helmet and other protective equipment during sports activities. If you have firearms in your house, make sure you follow all gun safety procedures. Seek help if you have been physically or sexually abused. What's next? Go to your health care provider once a year for an annual wellness visit. Ask your health care provider how often you should have your eyes and teeth checked. Stay up to date on all vaccines. This information is not intended to replace advice given to you by your health care provider. Make sure you discuss any questions you have with your health care provider. Document Revised: 12/13/2020 Document Reviewed: 12/13/2020 Elsevier Patient Education  [place].

## 2022-05-31 NOTE — Assessment & Plan Note (Signed)
Immunizations UTD. Influenza vaccine provided today. Pap smear UTD, follows with GYN  Discussed the importance of a healthy diet and regular exercise in order for weight loss, and to reduce the risk of further co-morbidity.  Exam stable. Labs pending.  Follow up in 1 year for repeat physical.

## 2022-06-03 LAB — CYCLIC CITRUL PEPTIDE ANTIBODY, IGG: Cyclic Citrullin Peptide Ab: <16 UNITS

## 2022-06-03 LAB — RHEUMATOID FACTOR: Rheumatoid fact SerPl-aCnc: <14 IU/mL (ref <14)

## 2022-10-04 ENCOUNTER — Ambulatory Visit: Payer: 59 | Admitting: Primary Care

## 2023-06-03 ENCOUNTER — Encounter: Payer: 59 | Admitting: Primary Care

## 2023-06-06 ENCOUNTER — Encounter: Payer: 59 | Admitting: Primary Care

## 2023-07-04 ENCOUNTER — Encounter: Payer: Self-pay | Admitting: Primary Care

## 2023-07-04 ENCOUNTER — Ambulatory Visit (INDEPENDENT_AMBULATORY_CARE_PROVIDER_SITE_OTHER): Payer: Commercial Managed Care - HMO | Admitting: Primary Care

## 2023-07-04 VITALS — BP 124/82 | HR 95 | Temp 98.1°F | Ht 61.5 in | Wt 146.0 lb

## 2023-07-04 DIAGNOSIS — M2550 Pain in unspecified joint: Secondary | ICD-10-CM | POA: Diagnosis not present

## 2023-07-04 DIAGNOSIS — Z0001 Encounter for general adult medical examination with abnormal findings: Secondary | ICD-10-CM | POA: Diagnosis not present

## 2023-07-04 DIAGNOSIS — Z0000 Encounter for general adult medical examination without abnormal findings: Secondary | ICD-10-CM

## 2023-07-04 LAB — CBC
HCT: 39.3 % (ref 36.0–46.0)
Hemoglobin: 13.4 g/dL (ref 12.0–15.0)
MCHC: 34 g/dL (ref 30.0–36.0)
MCV: 91.1 fL (ref 78.0–100.0)
Platelets: 296 10*3/uL (ref 150.0–400.0)
RBC: 4.31 Mil/uL (ref 3.87–5.11)
RDW: 12.7 % (ref 11.5–15.5)
WBC: 8 10*3/uL (ref 4.0–10.5)

## 2023-07-04 LAB — COMPREHENSIVE METABOLIC PANEL
ALT: 8 U/L (ref 0–35)
AST: 15 U/L (ref 0–37)
Albumin: 4.6 g/dL (ref 3.5–5.2)
Alkaline Phosphatase: 42 U/L (ref 39–117)
BUN: 9 mg/dL (ref 6–23)
CO2: 27 meq/L (ref 19–32)
Calcium: 9.4 mg/dL (ref 8.4–10.5)
Chloride: 101 meq/L (ref 96–112)
Creatinine, Ser: 0.63 mg/dL (ref 0.40–1.20)
GFR: 118.1 mL/min (ref >60.00)
Glucose, Bld: 79 mg/dL (ref 70–99)
Potassium: 4.1 meq/L (ref 3.5–5.1)
Sodium: 137 meq/L (ref 135–145)
Total Bilirubin: 0.6 mg/dL (ref 0.2–1.2)
Total Protein: 7.6 g/dL (ref 6.0–8.3)

## 2023-07-04 LAB — LIPID PANEL
Cholesterol: 156 mg/dL (ref 0–200)
HDL: 45.9 mg/dL (ref >39.00)
LDL Cholesterol: 92 mg/dL (ref 0–99)
NonHDL: 110.17
Total CHOL/HDL Ratio: 3
Triglycerides: 89 mg/dL (ref 0.0–149.0)
VLDL: 17.8 mg/dL (ref 0.0–40.0)

## 2023-07-04 NOTE — Patient Instructions (Signed)
 Stop by the lab prior to leaving today. I will notify you of your results once received.

## 2023-07-04 NOTE — Assessment & Plan Note (Signed)
 Stable.  Autoimmune testing negative last year. Negative for gout.  Continue to monitor.

## 2023-07-04 NOTE — Progress Notes (Signed)
 Subjective:    Patient ID: Hannah Walsh, female    DOB: 1991/04/12, 33 y.o.   MRN: [id number]  HPI  Hannah Walsh is a very pleasant 33 y.o. female who presents today for complete physical and follow up of chronic conditions.  Immunizations: -Tetanus: Completed in 2022 -Influenza: Declines influenza vaccine.  Diet: Fair diet.  Exercise: No regular exercise.  Eye exam: Completed several years ago  Dental exam: Completes semi-annually    Pap Smear: Follows with GYN.  BP Readings from Last 3 Encounters:  07/04/23 124/82  05/31/22 118/80  09/27/21 120/88       Review of Systems  Constitutional:  Negative for unexpected weight change.  HENT:  Negative for rhinorrhea.   Respiratory:  Negative for cough and shortness of breath.   Cardiovascular:  Negative for chest pain.  Gastrointestinal:  Negative for constipation and diarrhea.  Genitourinary:  Positive for menstrual problem. Negative for difficulty urinating.       Menstrual cycles prolonged once monthly since post partum.   Musculoskeletal:  Negative for arthralgias and myalgias.  Skin:  Negative for rash.  Allergic/Immunologic: Negative for environmental allergies.  Neurological:  Negative for dizziness, numbness and headaches.  Psychiatric/Behavioral:  The patient is not nervous/anxious.          Past Medical History:  Diagnosis Date   Abnormal chromosomal and genetic finding on antenatal screening of mother    Acute diarrhea 05/31/2022   Acute non-recurrent frontal sinusitis 08/31/2021   Bacterial conjunctivitis of left eye 08/31/2021   Cellulitis of face 04/13/2021   GERD (gastroesophageal reflux disease)    Hemorrhoids    Malignant melanoma of skin (HCC) 02/12/2018   Melanoma (HCC)    PONV (postoperative nausea and vomiting)    Sensation of foreign body in throat 10/30/2018   Single liveborn, born in hospital, delivered by vaginal delivery 01/08/2018   Had a daughter 8lb 3oz   Spinal headache    Strep  pharyngitis 09/27/2021   Varicose vein of leg     Social History   Socioeconomic History   Marital status: Married    Spouse name: Not on file   Number of children: Not on file   Years of education: Not on file   Highest education level: Associate degree: academic program  Occupational History   Not on file  Tobacco Use   Smoking status: Never   Smokeless tobacco: Never  Vaping Use   Vaping status: Never Used  Substance and Sexual Activity   Alcohol use: No   Drug use: No   Sexual activity: Yes    Partners: Male  Other Topics Concern   Not on file  Social History Narrative   Married.   1 child.   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   Social Drivers of Corporate Investment Banker Strain: Low Risk  (07/03/2023)   Overall Financial Resource Strain (CARDIA)    Difficulty of Paying Living Expenses: Not hard at all  Food Insecurity: No Food Insecurity (07/03/2023)   Hunger Vital Sign    Worried About Running Out of Food in the Last Year: Never true    Ran Out of Food in the Last Year: Never true  Transportation Needs: No Transportation Needs (07/03/2023)   PRAPARE - Administrator, Civil Service (Medical): No    Lack of Transportation (Non-Medical): No  Physical Activity: Insufficiently Active (07/03/2023)   Exercise Vital Sign    Days of Exercise  per Week: 2 days    Minutes of Exercise per Session: 20 min  Stress: No Stress Concern Present (07/03/2023)   [employer] of Occupational Health - Occupational Stress Questionnaire    Feeling of Stress : Not at all  Social Connections: Moderately Integrated (07/03/2023)   Social Connection and Isolation Panel [NHANES]    Frequency of Communication with Friends and Family: More than three times a week    Frequency of Social Gatherings with Friends and Family: More than three times a week    Attends Religious Services: More than 4 times per year    Active Member of [employer] or Organizations: No     Attends Engineer, Structural: Not on file    Marital Status: Married  Catering Manager Violence: Not on file    Past Surgical History:  Procedure Laterality Date   excision of melanoma      Family History  Problem Relation Age of Onset   Hyperlipidemia Mother    Heart disease Father 55       MI   Hyperlipidemia Father    Heart disease Maternal Grandfather 40       died of MI   Hyperlipidemia Maternal Grandfather    Breast cancer Maternal Grandmother    Hyperlipidemia Maternal Grandmother    Diabetes Maternal Grandmother    Cervical cancer Maternal Grandmother    Hyperlipidemia Paternal Grandmother    Lung cancer Paternal Grandmother    Heart disease Paternal Grandfather    Hyperlipidemia Paternal Grandfather     No Known Allergies  No current outpatient medications on file prior to visit.   No current facility-administered medications on file prior to visit.    BP 124/82   Pulse 95   Temp 98.1 F (36.7 C) (Temporal)   Ht 5' 1.5 (1.562 m)   Wt 146 lb (66.2 kg)   LMP 06/14/2023 (Exact Date)   SpO2 100%   Breastfeeding No   BMI 27.14 kg/m  Objective:   Physical Exam HENT:     Right Ear: Tympanic membrane and ear canal normal.     Left Ear: Tympanic membrane and ear canal normal.  Eyes:     Pupils: Pupils are equal, round, and reactive to light.  Cardiovascular:     Rate and Rhythm: Normal rate and regular rhythm.  Pulmonary:     Effort: Pulmonary effort is normal.     Breath sounds: Normal breath sounds.  Abdominal:     General: Bowel sounds are normal.     Palpations: Abdomen is soft.     Tenderness: There is no abdominal tenderness.  Musculoskeletal:        General: Normal range of motion.     Cervical back: Neck supple.  Skin:    General: Skin is warm and dry.  Neurological:     Mental Status: She is alert and oriented to person, place, and time.     Cranial Nerves: No cranial nerve deficit.     Deep Tendon Reflexes:     Reflex  Scores:      Patellar reflexes are 2+ on the right side and 2+ on the left side. Psychiatric:        Mood and Affect: Mood normal.           Assessment & Plan:  Preventative health care Assessment & Plan: Immunizations UTD. Pap smear UTD.  Follows with gynecology  Discussed the importance of a healthy diet and regular exercise in order for weight loss, and  to reduce the risk of further co-morbidity.  Exam stable. Labs pending.  Follow up in 1 year for repeat physical.   Orders: -     Comprehensive metabolic panel -     CBC -     Lipid panel  Arthralgia, unspecified joint Assessment & Plan: Stable.  Autoimmune testing negative last year. Negative for gout.  Continue to monitor.         Amorina Doerr K Ted Leonhart, NP

## 2023-07-04 NOTE — Assessment & Plan Note (Signed)
 Immunizations UTD. Pap smear UTD.  Follows with gynecology  Discussed the importance of a healthy diet and regular exercise in order for weight loss, and to reduce the risk of further co-morbidity.  Exam stable. Labs pending.  Follow up in 1 year for repeat physical.

## 2024-07-09 ENCOUNTER — Ambulatory Visit: Payer: Self-pay | Admitting: Primary Care

## 2024-07-09 ENCOUNTER — Ambulatory Visit: Admitting: Primary Care

## 2024-07-09 VITALS — BP 122/82 | HR 98 | Temp 98.5°F | Ht 61.5 in | Wt 138.2 lb

## 2024-07-09 DIAGNOSIS — Z832 Family history of diseases of the blood and blood-forming organs and certain disorders involving the immune mechanism: Secondary | ICD-10-CM

## 2024-07-09 DIAGNOSIS — Z8249 Family history of ischemic heart disease and other diseases of the circulatory system: Secondary | ICD-10-CM

## 2024-07-09 DIAGNOSIS — Z23 Encounter for immunization: Secondary | ICD-10-CM | POA: Diagnosis not present

## 2024-07-09 DIAGNOSIS — E7841 Elevated Lipoprotein(a): Secondary | ICD-10-CM

## 2024-07-09 DIAGNOSIS — Z13220 Encounter for screening for lipoid disorders: Secondary | ICD-10-CM | POA: Diagnosis not present

## 2024-07-09 DIAGNOSIS — Z0000 Encounter for general adult medical examination without abnormal findings: Secondary | ICD-10-CM

## 2024-07-09 LAB — COMPREHENSIVE METABOLIC PANEL WITH GFR
ALT: 9 U/L (ref 3–35)
AST: 13 U/L (ref 5–37)
Albumin: 4.1 g/dL (ref 3.5–5.2)
Alkaline Phosphatase: 34 U/L — ABNORMAL LOW (ref 39–117)
BUN: 10 mg/dL (ref 6–23)
CO2: 28 meq/L (ref 19–32)
Calcium: 8.9 mg/dL (ref 8.4–10.5)
Chloride: 103 meq/L (ref 96–112)
Creatinine, Ser: 0.7 mg/dL (ref 0.40–1.20)
GFR: 114.32 mL/min
Glucose, Bld: 84 mg/dL (ref 70–99)
Potassium: 4 meq/L (ref 3.5–5.1)
Sodium: 136 meq/L (ref 135–145)
Total Bilirubin: 0.5 mg/dL (ref 0.2–1.2)
Total Protein: 7.1 g/dL (ref 6.0–8.3)

## 2024-07-09 LAB — LIPID PANEL
Cholesterol: 157 mg/dL (ref 28–200)
HDL: 55.1 mg/dL
LDL Cholesterol: 71 mg/dL (ref 10–99)
NonHDL: 101.87
Total CHOL/HDL Ratio: 3
Triglycerides: 156 mg/dL — ABNORMAL HIGH (ref 10.0–149.0)
VLDL: 31.2 mg/dL (ref 0.0–40.0)

## 2024-07-09 NOTE — Assessment & Plan Note (Signed)
 Sister with elevated lipoprotein a.  Repeat lipid panel pending. Lipoprotein a pending.  If lipoprotein a is elevated then we will proceed with CT coronary calcium score.  She agrees.

## 2024-07-09 NOTE — Progress Notes (Signed)
 "  Subjective:    Patient ID: Hannah Walsh, female    DOB: 02/01/1991, 34 y.o.   MRN: [id number]  Hannah Walsh is a very pleasant 34 y.o. female who presents today for complete physical and follow up of chronic conditions.  She has a family history of early CAD with MI in father at age 53.  Paternal grandfather passed away from a heart attack.  Her sister recently completed the lipoprotein a test for which was very elevated.  Immunizations: -Tetanus: Completed in 2022 -Influenza: Influenza vaccine provided today.  -HPV: Completed series  Diet: Fair diet.  Exercise: Regular exercise.  Eye exam: Completed > 1 year ago  Dental exam: Completes semi-annually    Pap Smear: Completed in July 2022, follows with GYN  BP Readings from Last 3 Encounters:  07/09/24 122/82  07/04/23 124/82  05/31/22 118/80    Wt Readings from Last 3 Encounters:  07/09/24 138 lb 4 oz (62.7 kg)  07/04/23 146 lb (66.2 kg)  05/31/22 133 lb 9.6 oz (60.6 kg)       Review of Systems  Constitutional:  Negative for unexpected weight change.  HENT:  Negative for rhinorrhea.   Respiratory:  Negative for cough and shortness of breath.   Cardiovascular:  Negative for chest pain.  Gastrointestinal:  Negative for constipation and diarrhea.  Genitourinary:  Negative for difficulty urinating and menstrual problem.  Musculoskeletal:  Negative for arthralgias and myalgias.  Skin:  Negative for rash.  Allergic/Immunologic: Negative for environmental allergies.  Neurological:  Negative for dizziness and headaches.  Psychiatric/Behavioral:  The patient is not nervous/anxious.          Past Medical History:  Diagnosis Date   Abnormal chromosomal and genetic finding on antenatal screening of mother    Acute diarrhea 05/31/2022   Acute non-recurrent frontal sinusitis 08/31/2021   Bacterial conjunctivitis of left eye 08/31/2021   Cellulitis of face 04/13/2021   GERD (gastroesophageal reflux disease)     Hemorrhoids    Malignant melanoma of skin (HCC) 02/12/2018   Melanoma (HCC)    PONV (postoperative nausea and vomiting)    Sensation of foreign body in throat 10/30/2018   Single liveborn, born in hospital, delivered by vaginal delivery 01/08/2018   Had a daughter 8lb 3oz   Spinal headache    Strep pharyngitis 09/27/2021   Varicose vein of leg     Social History   Socioeconomic History   Marital status: Married    Spouse name: Not on file   Number of children: Not on file   Years of education: Not on file   Highest education level: Associate degree: occupational, scientist, product/process development, or vocational program  Occupational History   Not on file  Tobacco Use   Smoking status: Never   Smokeless tobacco: Never  Vaping Use   Vaping status: Never Used  Substance and Sexual Activity   Alcohol use: Yes    Alcohol/week: 2.0 standard drinks of alcohol    Types: 1 Glasses of wine, 1 Standard drinks or equivalent per week   Drug use: Never   Sexual activity: Yes    Partners: Male    Birth control/protection: Other-see comments    Comment: Husband had vesectomy  Other Topics Concern   Not on file  Social History Narrative   Married.   1 child.   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   Social Drivers of Health   Tobacco Use: Low Risk (07/09/2024)   Patient History  Smoking Tobacco Use: Never    Smokeless Tobacco Use: Never    Passive Exposure: Not on file  Financial Resource Strain: Low Risk (07/09/2024)   Overall Financial Resource Strain (CARDIA)    Difficulty of Paying Living Expenses: Not hard at all  Food Insecurity: No Food Insecurity (07/09/2024)   Epic    Worried About Programme Researcher, Broadcasting/film/video in the Last Year: Never true    Ran Out of Food in the Last Year: Never true  Transportation Needs: No Transportation Needs (07/09/2024)   Epic    Lack of Transportation (Medical): No    Lack of Transportation (Non-Medical): No  Physical Activity: Insufficiently Active  (07/09/2024)   Exercise Vital Sign    Days of Exercise per Week: 4 days    Minutes of Exercise per Session: 20 min  Stress: No Stress Concern Present (07/09/2024)   [employer] of Occupational Health - Occupational Stress Questionnaire    Feeling of Stress: Only a little  Social Connections: Moderately Integrated (07/09/2024)   Social Connection and Isolation Panel    Frequency of Communication with Friends and Family: More than three times a week    Frequency of Social Gatherings with Friends and Family: More than three times a week    Attends Religious Services: More than 4 times per year    Active Member of Clubs or Organizations: No    Attends Engineer, Structural: Not on file    Marital Status: Married  Catering Manager Violence: Not on file  Depression (PHQ2-9): Low Risk (07/09/2024)   Depression (PHQ2-9)    PHQ-2 Score: 1  Alcohol Screen: Low Risk (07/09/2024)   Alcohol Screen    Last Alcohol Screening Score (AUDIT): 2  Housing: Low Risk (07/09/2024)   Epic    Unable to Pay for Housing in the Last Year: No    Number of Times Moved in the Last Year: 0    Homeless in the Last Year: No  Utilities: Not on file  Health Literacy: Not on file    Past Surgical History:  Procedure Laterality Date   excision of melanoma      Family History  Problem Relation Age of Onset   Hyperlipidemia Mother    Obesity Mother    Heart attack Father 7       MI   Hyperlipidemia Father    COPD Father    Hypertension Father    Obesity Father    Breast cancer Maternal Grandmother    Hyperlipidemia Maternal Grandmother    Diabetes Maternal Grandmother    Cervical cancer Maternal Grandmother    Cancer Maternal Grandmother    Varicose Veins Maternal Grandmother    Heart disease Maternal Grandfather 40       died of MI   Hyperlipidemia Maternal Grandfather    Hyperlipidemia Paternal Grandmother    Lung cancer Paternal Grandmother    Heart attack Paternal Grandfather     Hyperlipidemia Paternal Grandfather     Allergies[1]  Medications Ordered Prior to Encounter[2]  BP 122/82   Pulse 98   Temp 98.5 F (36.9 C) (Oral)   Ht 5' 1.5 (1.562 m)   Wt 138 lb 4 oz (62.7 kg)   LMP 06/27/2024   SpO2 97%   BMI 25.70 kg/m  Objective:   Physical Exam HENT:     Right Ear: Tympanic membrane and ear canal normal.     Left Ear: Tympanic membrane and ear canal normal.  Eyes:     Pupils: Pupils  are equal, round, and reactive to light.  Cardiovascular:     Rate and Rhythm: Normal rate and regular rhythm.  Pulmonary:     Effort: Pulmonary effort is normal.     Breath sounds: Normal breath sounds.  Abdominal:     General: Bowel sounds are normal.     Palpations: Abdomen is soft.     Tenderness: There is no abdominal tenderness.  Musculoskeletal:        General: Normal range of motion.     Cervical back: Neck supple.  Skin:    General: Skin is warm and dry.  Neurological:     Mental Status: She is alert and oriented to person, place, and time.     Cranial Nerves: No cranial nerve deficit.     Deep Tendon Reflexes:     Reflex Scores:      Patellar reflexes are 2+ on the right side and 2+ on the left side. Psychiatric:        Mood and Affect: Mood normal.     Physical Exam        Assessment & Plan:  Preventative health care Assessment & Plan: Immunizations UTD. Influenza vaccine provided today.  Pap smear UTD. Follows with GYN  Discussed the importance of a healthy diet and regular exercise in order for weight loss, and to reduce the risk of further co-morbidity.  Exam stable. Labs pending.  Follow up in 1 year for repeat physical.   Orders: -     Lipid panel -     Comprehensive metabolic panel with GFR  Family history of early CAD Assessment & Plan: Sister with elevated lipoprotein a.  Repeat lipid panel pending. Lipoprotein a pending.  If lipoprotein a is elevated then we will proceed with CT coronary calcium score.  She  agrees.  Orders: -     Lipid panel -     Comprehensive metabolic panel with GFR -     Lipoprotein A (LPA)  Need for influenza vaccination -     Flu vaccine trivalent PF, 6mos and older(Flulaval,Afluria,Fluarix,Fluzone)    Assessment and Plan Assessment & Plan         Comer MARLA Gaskins, NP        [1] No Known Allergies [2]  Current Outpatient Medications on File Prior to Visit  Medication Sig Dispense Refill   norgestimate-ethinyl estradiol (ORTHO-CYCLEN) 0.25-35 MG-MCG tablet Take 1 tablet by mouth daily.     No current facility-administered medications on file prior to visit.   "

## 2024-07-09 NOTE — Patient Instructions (Signed)
 Stop by the lab prior to leaving today. I will notify you of your results once received.   It was a pleasure to see you today!

## 2024-07-09 NOTE — Assessment & Plan Note (Signed)
 Immunizations UTD. Influenza vaccine provided today. Pap smear UTD.  Follows with GYN  Discussed the importance of a healthy diet and regular exercise in order for weight loss, and to reduce the risk of further co-morbidity.  Exam stable. Labs pending.  Follow up in 1 year for repeat physical.

## 2024-07-16 LAB — LIPOPROTEIN A (LPA): Lipoprotein (a): 339 nmol/L — ABNORMAL HIGH

## 2024-07-21 ENCOUNTER — Encounter: Payer: Self-pay | Admitting: Internal Medicine

## 2024-07-21 ENCOUNTER — Ambulatory Visit: Attending: Internal Medicine | Admitting: Internal Medicine

## 2024-07-21 VITALS — BP 136/88 | HR 83 | Ht 61.0 in | Wt 139.0 lb

## 2024-07-21 DIAGNOSIS — Z8249 Family history of ischemic heart disease and other diseases of the circulatory system: Secondary | ICD-10-CM

## 2024-07-21 DIAGNOSIS — E7841 Elevated Lipoprotein(a): Secondary | ICD-10-CM | POA: Diagnosis not present

## 2024-07-21 DIAGNOSIS — Z79899 Other long term (current) drug therapy: Secondary | ICD-10-CM | POA: Diagnosis not present

## 2024-07-21 DIAGNOSIS — Z0000 Encounter for general adult medical examination without abnormal findings: Secondary | ICD-10-CM | POA: Diagnosis not present

## 2024-07-21 MED ORDER — ROSUVASTATIN CALCIUM 5 MG PO TABS: 5.0000 mg | ORAL_TABLET | Freq: Every day | ORAL | 3 refills | Status: AC

## 2024-07-21 MED ORDER — ASPIRIN 81 MG PO TBEC: 81.0000 mg | DELAYED_RELEASE_TABLET | Freq: Every day | ORAL | Status: AC

## 2024-07-21 NOTE — Progress Notes (Unsigned)
" °  Cardiology Office Note:  .   Date:  07/21/2024  ID:  Hannah Walsh, DOB 05/15/1991, MRN [id number] PCP: Gretta Comer POUR, NP  [facility] Providers Cardiologist:  None { Click to update primary MD,subspecialty MD or APP then REFRESH:1}    History of Present Illness: Hannah Walsh is a 34 y.o. 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  Paternal cousin had high Lp(a).  Sister had high one, too.  Pt's has been highest.  Has varicose veins and has had vein ablation x 3.  No LE edema.  No h/o blood clots.  No CP, SOB, or palpitations.  LP(a) 339  ROS: See HPI  Studies Reviewed: SABRA   EKG Interpretation Date/Time:  Wednesday July 21 2024 15:09:04 EST Ventricular Rate:  83 PR Interval:  152 QRS Duration:  74 QT Interval:  378 QTC Calculation: 444 R Axis:   8  Text Interpretation: Normal sinus rhythm Possible Left atrial enlargement Borderline ECG When compared with ECG of 27-Jan-2009 15:24, No significant change was found Confirmed by Diarra Kos, Lonni [id number]) on 07/21/2024 3:12:35 PM    *** Risk Assessment/Calculations:   {Does this patient have ATRIAL FIBRILLATION?:[id number]}         Physical Exam:   VS:  BP 136/88 (BP Location: Left Arm, Patient Position: Sitting, Cuff Size: Normal)   Pulse 83 Comment: 90 oximeter  Ht 5' 1 (1.549 m)   Wt 139 lb (63 kg)   LMP 06/27/2024   SpO2 99%   BMI 26.26 kg/m    Wt Readings from Last 3 Encounters:  07/21/24 139 lb (63 kg)  07/09/24 138 lb 4 oz (62.7 kg)  07/04/23 146 lb (66.2 kg)    General:  NAD. Neck: No JVD or HJR. Lungs: Clear to auscultation bilaterally without wheezes or crackles. Heart: Regular rate and rhythm without murmurs, rubs, or gallops. Abdomen: Soft, nontender, nondistended. Extremities: No lower extremity edema.  ASSESSMENT AND PLAN: .    ***    {Are you ordering a CV Procedure (e.g. stress test,  cath, DCCV, TEE, etc)?   Press F2        :[id number]}  Dispo: ***  Signed, Lonni Hanson, MD  "

## 2024-07-21 NOTE — Patient Instructions (Signed)
 Medication Instructions:  Your physician recommends the following medication changes.   START TAKING: Aspirin  81 mg by mouth daily  Rosuvastatin  5 mg by mouth daily   *If you need a refill on your cardiac medications before your next appointment, please call your pharmacy*  Lab Work: Your provider would like for you to return in April, 2026 to have the following labs drawn: Lipid, ALT.   Please go to [facility] [address] ([facility] [address], [place] [address] You do not need an appointment.  They are open from 8 am- 4:30 pm.  Lunch from 1:00 pm- 2:00 pm You will need to be fasting.    Testing/Procedures: No test ordered today   Follow-Up: At [facility], you and your health needs are our priority.  As part of our continuing mission to provide you with exceptional heart care, our providers are all part of one team.  This team includes your primary Cardiologist (physician) and Advanced Practice Providers or APPs (Physician Assistants and Nurse Practitioners) who all work together to provide you with the care you need, when you need it.  Your next appointment:   3 month(s)  Provider:   You may see Lonni Hanson, MD or one of the following Advanced Practice Providers on your designated Care Team:   Lonni Meager, NP Lesley Maffucci, PA-C Bernardino Bring, PA-C Cadence Warren, PA-C Tylene Lunch, NP Barnie Hila, NP

## 2024-07-22 ENCOUNTER — Encounter: Payer: Self-pay | Admitting: Internal Medicine

## 2024-07-26 ENCOUNTER — Encounter: Payer: Self-pay | Admitting: Internal Medicine

## 2024-11-05 ENCOUNTER — Ambulatory Visit: Admitting: Internal Medicine

## 2025-07-15 ENCOUNTER — Encounter: Admitting: Primary Care
